# Patient Record
Sex: Female | Born: 1975 | Race: White | Hispanic: No | Marital: Married | Smoking: Never smoker
Health system: Southern US, Community
[De-identification: ages and names within clinical notes are randomized; demographics above are authoritative.]

## PROBLEM LIST (undated history)

## (undated) ENCOUNTER — Inpatient Hospital Stay (HOSPITAL_COMMUNITY): Payer: Self-pay

## (undated) DIAGNOSIS — J45909 Unspecified asthma, uncomplicated: Secondary | ICD-10-CM

## (undated) DIAGNOSIS — N979 Female infertility, unspecified: Secondary | ICD-10-CM

## (undated) DIAGNOSIS — M797 Fibromyalgia: Secondary | ICD-10-CM

## (undated) DIAGNOSIS — I341 Nonrheumatic mitral (valve) prolapse: Secondary | ICD-10-CM

## (undated) DIAGNOSIS — Z87442 Personal history of urinary calculi: Secondary | ICD-10-CM

## (undated) DIAGNOSIS — N809 Endometriosis, unspecified: Secondary | ICD-10-CM

## (undated) HISTORY — PX: EXPLORATORY LAPAROTOMY: SUR591

## (undated) HISTORY — PX: WISDOM TOOTH EXTRACTION: SHX21

## (undated) HISTORY — PX: LAPAROSCOPY: SHX197

## (undated) HISTORY — PX: OTHER SURGICAL HISTORY: SHX169

---

## 2007-01-02 ENCOUNTER — Ambulatory Visit (HOSPITAL_COMMUNITY): Admission: RE | Admit: 2007-01-02 | Discharge: 2007-01-02 | Payer: Self-pay | Admitting: Obstetrics and Gynecology

## 2007-01-30 ENCOUNTER — Ambulatory Visit (HOSPITAL_COMMUNITY): Admission: RE | Admit: 2007-01-30 | Discharge: 2007-01-30 | Payer: Self-pay | Admitting: Obstetrics and Gynecology

## 2007-01-30 IMAGING — US US FOLLICLE
1 series · 14 of 16 positions shown · non-contrast
Comparison: none

CLINICAL DATA: Femara cycle, days 3-7.
TRANSVAGINAL PELVIC ULTRASOUND - FOLLICLE STUDY:
TECHNIQUE: Transvaginal ultrasound examination of the pelvis was performed to evaluate the ovaries for follicles.  The uterus, adnexae, and cul-de-sac were also evaluated.

[Series 1: us follicle · 0.10mm/px · 14 of 61 slices shown]
[im 1/61]
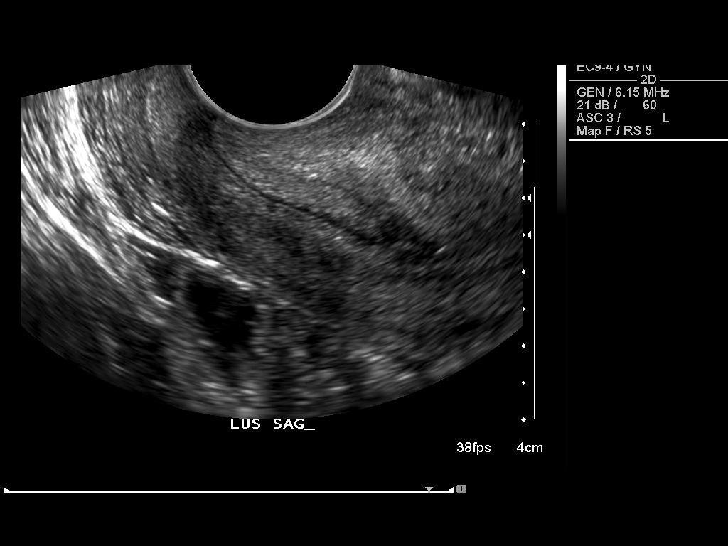
[im 5/61]
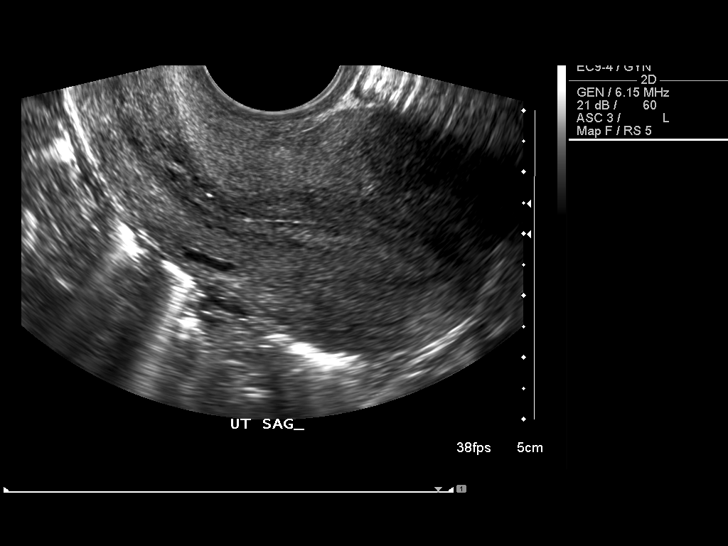
[im 9/61]
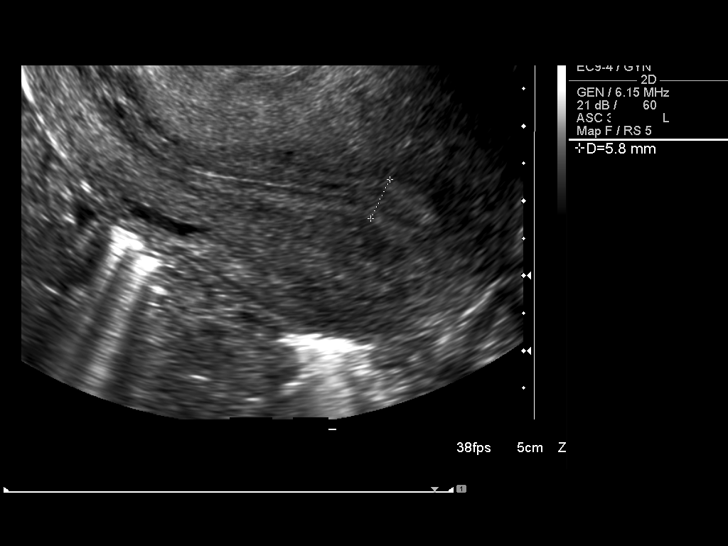
[im 17/61]
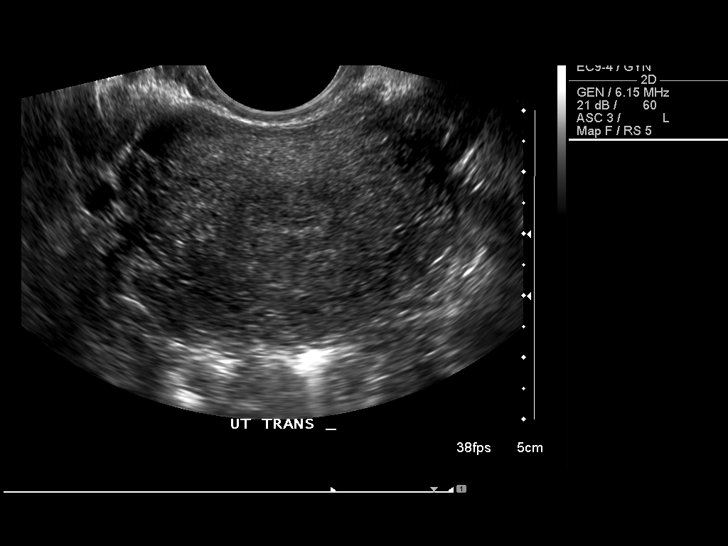
[im 21/61]
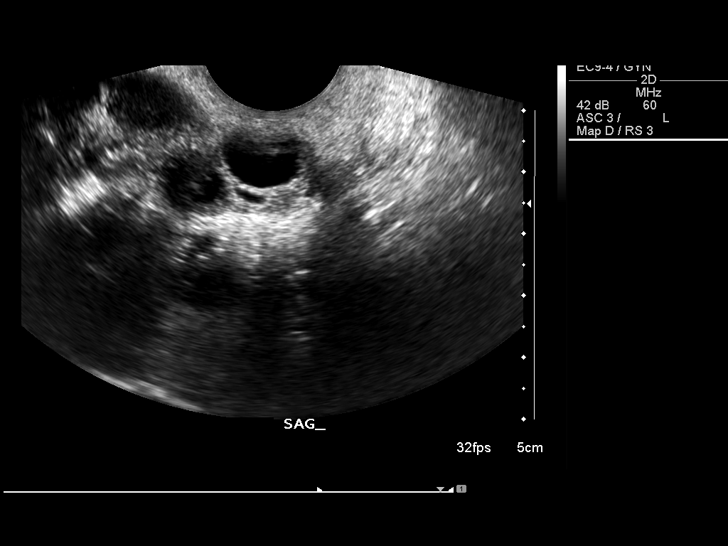
[im 25/61]
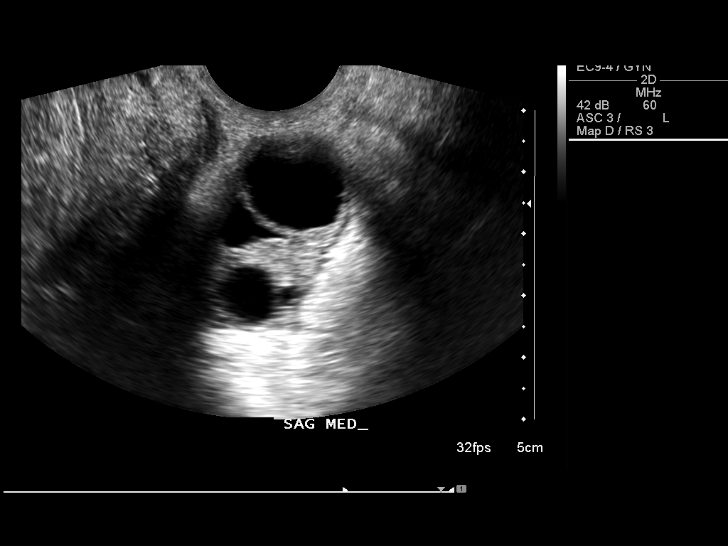
[im 29/61]
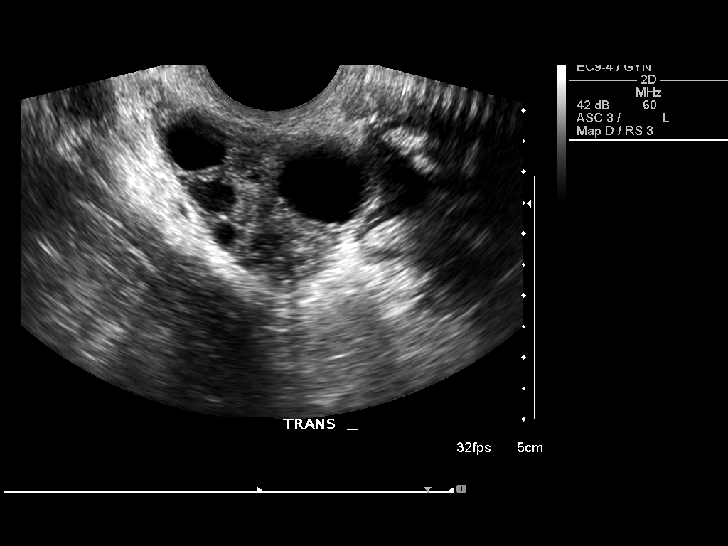
[im 33/61]
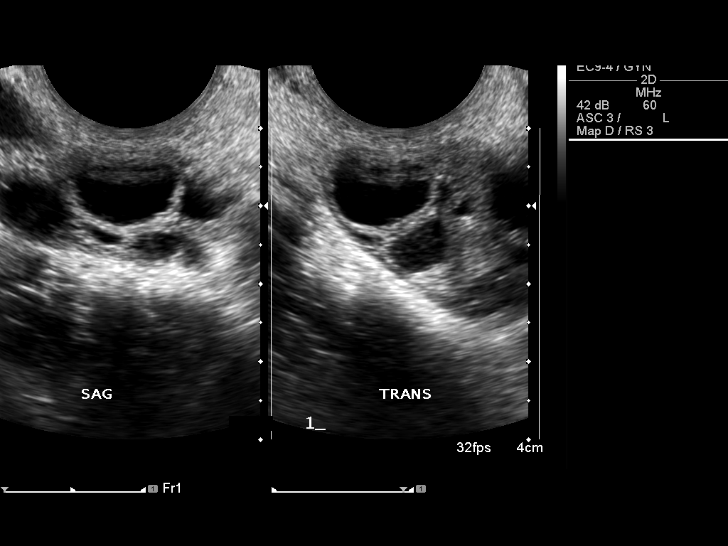
[im 37/61]
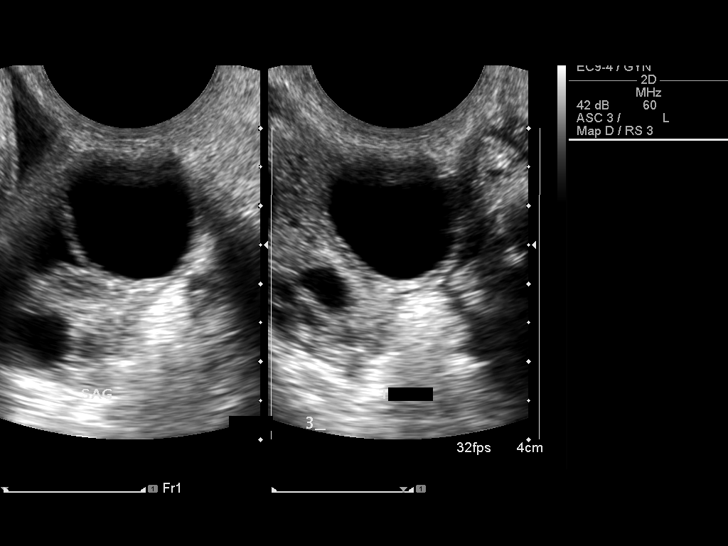
[im 41/61]
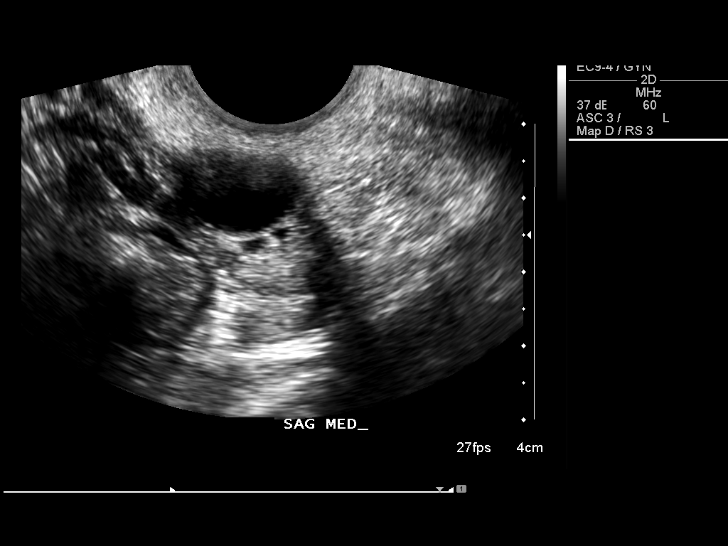
[im 49/61]
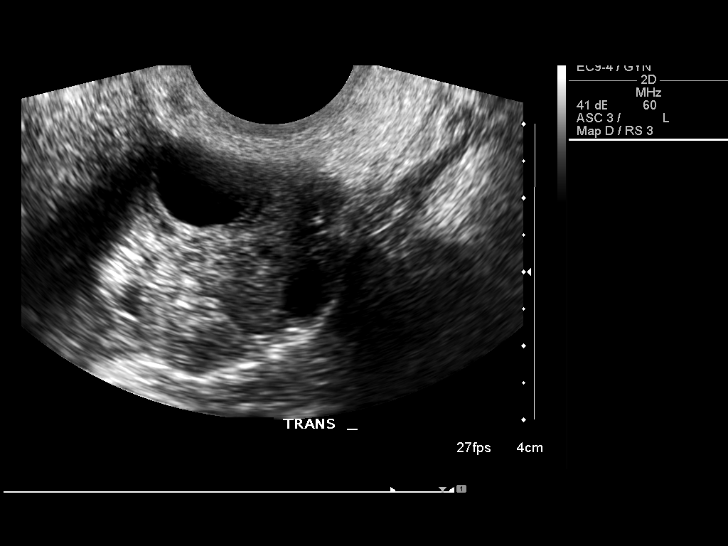
[im 53/61]
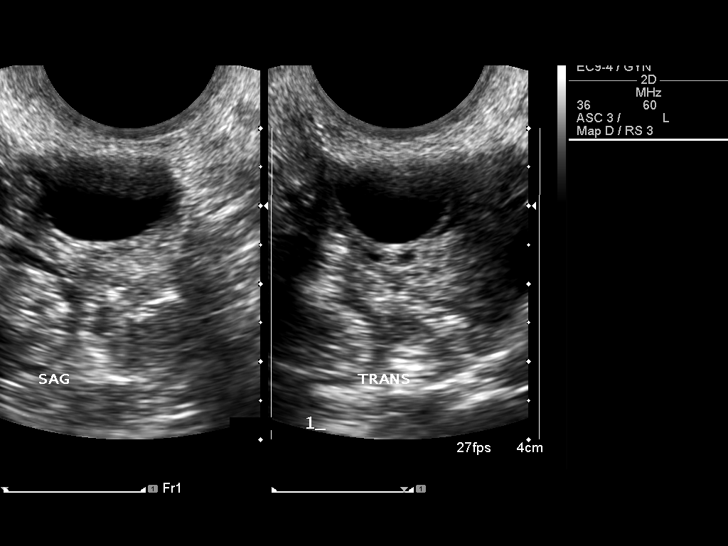
[im 57/61]
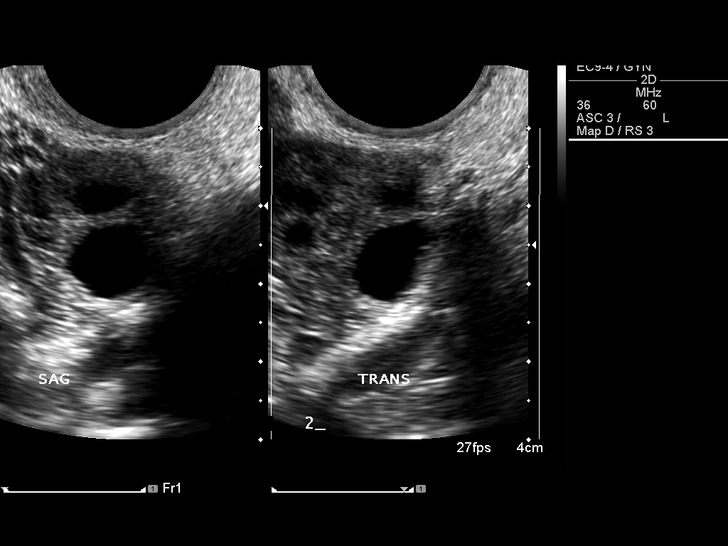
[im 61/61]
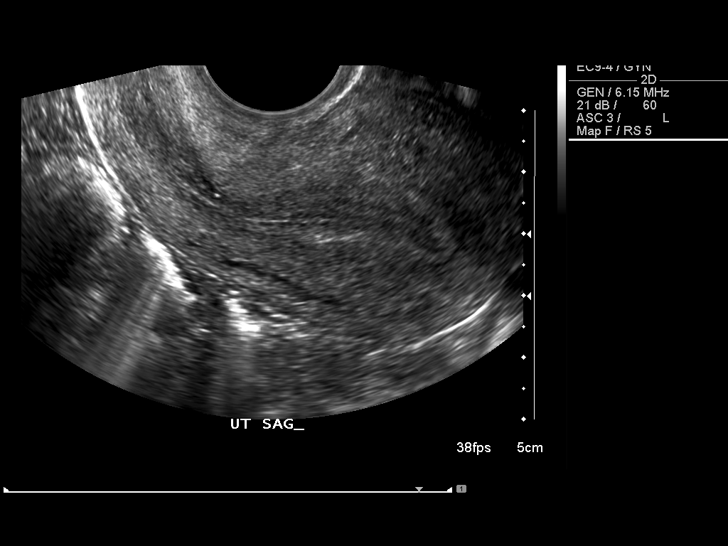

[14 of 16 positions shown; findings below may reference images not displayed]

FINDINGS: The uterus has a normal size, shape, and echotexture.  The endometrium is trilayered and measures 5-6 mm in width.  Both ovaries contain follicles.  On the right, there are 13 follicles that are less than 1 cm.  There are 3 follicles on the right  that measure over 1 cm.  The largest of these measures 19.8 x 17.4 x 13.0 mm in dimension.  The left ovary contains 7 follicles that are less than 1 cm.  There is 1 follicle greater than a centimeter on the left which measures 15.4 x 14.1 x 10.1 mm.  There is no free fluid within the cul-de-sac.
IMPRESSION: As above.

## 2007-02-27 ENCOUNTER — Ambulatory Visit (HOSPITAL_COMMUNITY): Admission: RE | Admit: 2007-02-27 | Discharge: 2007-02-27 | Payer: Self-pay | Admitting: Obstetrics and Gynecology

## 2007-04-30 ENCOUNTER — Emergency Department: Payer: Self-pay

## 2007-05-06 ENCOUNTER — Ambulatory Visit (HOSPITAL_COMMUNITY): Admission: RE | Admit: 2007-05-06 | Discharge: 2007-05-06 | Payer: Self-pay | Admitting: Urology

## 2010-05-09 ENCOUNTER — Other Ambulatory Visit: Admission: RE | Admit: 2010-05-09 | Discharge: 2010-05-09 | Payer: Self-pay | Admitting: Obstetrics and Gynecology

## 2010-11-14 ENCOUNTER — Inpatient Hospital Stay (HOSPITAL_COMMUNITY)
Admission: AD | Admit: 2010-11-14 | Discharge: 2010-11-18 | DRG: 371 | Disposition: A | Discharge status: Home / Self Care | Payer: BC Managed Care – PPO | Source: Ambulatory Visit | Attending: Obstetrics and Gynecology | Admitting: Obstetrics and Gynecology

## 2010-11-14 DIAGNOSIS — O139 Gestational [pregnancy-induced] hypertension without significant proteinuria, unspecified trimester: Secondary | ICD-10-CM | POA: Diagnosis present

## 2010-11-14 DIAGNOSIS — O3660X0 Maternal care for excessive fetal growth, unspecified trimester, not applicable or unspecified: Principal | ICD-10-CM | POA: Diagnosis present

## 2010-11-14 DIAGNOSIS — O409XX0 Polyhydramnios, unspecified trimester, not applicable or unspecified: Secondary | ICD-10-CM | POA: Diagnosis present

## 2010-11-14 LAB — COMPREHENSIVE METABOLIC PANEL
AST: 27 U/L (ref 0–37)
Albumin: 3.1 g/dL — ABNORMAL LOW (ref 3.5–5.2)
Alkaline Phosphatase: 162 U/L — ABNORMAL HIGH (ref 39–117)
BUN: 11 mg/dL (ref 6–23)
Chloride: 105 mEq/L (ref 96–112)
Potassium: 4.1 mEq/L (ref 3.5–5.1)
Total Bilirubin: 0.3 mg/dL (ref 0.3–1.2)
Total Protein: 6.8 g/dL (ref 6.0–8.3)

## 2010-11-14 LAB — CBC
MCHC: 32.4 g/dL (ref 30.0–36.0)
Platelets: 238 10*3/uL (ref 150–400)
RDW: 13.9 % (ref 11.5–15.5)
WBC: 12.2 10*3/uL — ABNORMAL HIGH (ref 4.0–10.5)

## 2010-11-14 LAB — URINE MICROSCOPIC-ADD ON

## 2010-11-14 LAB — URINALYSIS, ROUTINE W REFLEX MICROSCOPIC
Bilirubin Urine: NEGATIVE
Nitrite: NEGATIVE
Specific Gravity, Urine: 1.01 (ref 1.005–1.030)
Urobilinogen, UA: 0.2 mg/dL (ref 0.0–1.0)
pH: 5.5 (ref 5.0–8.0)

## 2010-11-14 LAB — DIFFERENTIAL
Basophils Absolute: 0 10*3/uL (ref 0.0–0.1)
Basophils Relative: 0 % (ref 0–1)
Eosinophils Absolute: 0.1 10*3/uL (ref 0.0–0.7)
Eosinophils Relative: 1 % (ref 0–5)
Monocytes Absolute: 1 10*3/uL (ref 0.1–1.0)

## 2010-11-14 LAB — LACTATE DEHYDROGENASE: LDH: 155 U/L (ref 94–250)

## 2010-11-14 LAB — URIC ACID: Uric Acid, Serum: 6 mg/dL (ref 2.4–7.0)

## 2010-11-15 ENCOUNTER — Inpatient Hospital Stay (HOSPITAL_COMMUNITY)
Admission: RE | Admit: 2010-11-15 | Payer: BC Managed Care – PPO | Source: Ambulatory Visit | Admitting: Obstetrics and Gynecology

## 2010-11-15 LAB — CBC
Hemoglobin: 11.1 g/dL — ABNORMAL LOW (ref 12.0–15.0)
MCH: 29.1 pg (ref 26.0–34.0)
Platelets: 203 10*3/uL (ref 150–400)
RBC: 3.82 MIL/uL — ABNORMAL LOW (ref 3.87–5.11)
WBC: 9.9 10*3/uL (ref 4.0–10.5)

## 2010-11-15 LAB — TYPE AND SCREEN: ABO/RH(D): B POS

## 2010-11-15 LAB — SURGICAL PCR SCREEN: MRSA, PCR: NEGATIVE

## 2010-11-15 LAB — ABO/RH: ABO/RH(D): B POS

## 2010-11-16 LAB — CBC
MCH: 28.8 pg (ref 26.0–34.0)
MCHC: 32.5 g/dL (ref 30.0–36.0)
MCV: 88.5 fL (ref 78.0–100.0)
Platelets: 193 10*3/uL (ref 150–400)
RDW: 14 % (ref 11.5–15.5)

## 2010-11-18 ENCOUNTER — Encounter (HOSPITAL_COMMUNITY)
Admission: RE | Admit: 2010-11-18 | Discharge: 2010-11-18 | Disposition: A | Discharge status: Home / Self Care | Payer: BC Managed Care – PPO | Source: Ambulatory Visit | Attending: Obstetrics and Gynecology | Admitting: Obstetrics and Gynecology

## 2010-11-18 DIAGNOSIS — O923 Agalactia: Secondary | ICD-10-CM | POA: Insufficient documentation

## 2010-11-18 NOTE — Op Note (Signed)
[name], Wanda Payne                 ACCOUNT NO.:  [id number]  MEDICAL RECORD NO.:  [id number]           PATIENT TYPE:  I  LOCATION:  9101                          FACILITY:  [facility]  PHYSICIAN:  Gerald Leitz, MD          DATE OF BIRTH:  Nov 13, 1975  DATE OF PROCEDURE:  11/15/2010 DATE OF DISCHARGE:                              OPERATIVE REPORT   PREOPERATIVE DIAGNOSES: 1. 39-week 3-day intrauterine pregnancy. 2. Macrosomia.  Estimated weight 10 pounds 2 ounces on ultrasound,     November 14, 2010. 3. Polyhydramnios. 4. Gestational hypertension.  POSTOPERATIVE DIAGNOSES: 1. 39-week 3-day intrauterine pregnancy. 2. Macrosomia.  Estimated weight 10 pounds 2 ounces on ultrasound,     November 14, 2010. 3. Polyhydramnios. 4. Gestational hypertension. 5. Thin meconium.  PROCEDURE:  Primary low-transverse cesarean section.  SURGEON:  Gerald Leitz, MD  ASSISTANT:  None.  ANESTHESIA:  Spinal.  FINDINGS:  Female infant, cephalic presentation, Apgars of 9 and 9 at 1 and 5 minutes respectively, weight of 9 pounds 15 ounces.  SPECIMEN:  Placenta sent to labor and delivery.  ESTIMATED BLOOD LOSS:  900 mL.  COMPLICATIONS:  None.  PROCEDURE IN DETAIL:  The patient was taken to the operating room where spinal anesthetic was placed.  She was prepped and draped in the usual sterile fashion.  A Pfannenstiel incision was made with a scalpel and carried down to the underlying layer of fascia.  The fascia was incised in the midline and the incision was extended laterally.  The inferior aspect of fascial incision was grasped with Kocher clamps, elevated, and underlying rectus muscles were dissected off.  This was repeated on the superior aspect of the fascial incision.  The rectus muscles were dissected in the midline and separated in the midline.  Peritoneum was identified and entered bluntly.  The incision was extended superiorly and inferiorly with good visualization of the bladder.  An  Alexis abdominal retractor was placed.  Vesicouterine peritoneum was identified, tented up, and entered with Metzenbaum scissors.  The incision was extended laterally and a bladder flap was created digitally.  Low-transverse incision was made with a scalpel.  The infant's head was removed and thin meconium was noted.  Mouth and nose were bulb suctioned.  Nuchal cord x1 was reduced.  The rest of the body was delivered.  The cord was clamped x2 and cut.  The infant was handed to the awaiting neonatologist.  Cord blood was drawn and the placenta was expressed.  The uterus was cleared of all clot and debris.  The uterine incision was repaired with 0-Vicryl in a running locked fashion. A second layer of suture was used and excellent hemostasis was noted. The abdomen was then copiously irrigated.  Excellent hemostasis was noted.  The Alexis retractor was removed.  Fascia was reapproximated with 0-PDS.  Scarpa fascia was reapproximated with 2-0 Vicryl.  Skin was closed with 4-0 Vicryl.  Sponge, lap, and needle counts were correct x2. The patient was taken to the recovery room awake in a stable condition. She was given clindamycin and gentamicin prior to incision.  Gerald Leitz, MD     TC/MEDQ  D:  11/15/2010  T:  11/15/2010  Job:  [id number]  Electronically Signed by Gerald Leitz MD on 11/18/2010 11:10:59 AM

## 2010-11-19 ENCOUNTER — Inpatient Hospital Stay (HOSPITAL_COMMUNITY): Admission: AD | Admit: 2010-11-19 | Payer: Self-pay | Source: Home / Self Care | Admitting: Obstetrics and Gynecology

## 2010-11-30 NOTE — Discharge Summary (Signed)
  [name], Wanda Payne                 ACCOUNT NO.:  [id number]  MEDICAL RECORD NO.:  [id number]           PATIENT TYPE:  I  LOCATION:  9101                          FACILITY:  [facility]  PHYSICIAN:  Gerald Leitz, MD          DATE OF BIRTH:  [birth year]  DATE OF ADMISSION:  11/14/2010 DATE OF DISCHARGE:  11/18/2010                              DISCHARGE SUMMARY   ADMISSION DIAGNOSES: 1. A 39-week 3-day intrauterine pregnancy. 2. Macrosomia. 3. Polyhydramnios. 4. Gestational hypertension.  DISCHARGE DIAGNOSES: 1. A 39-week 3-day intrauterine pregnancy. 2. Macrosomia. 3. Polyhydramnios. 4. Gestational hypertension. 5. Thin meconium. 6. Nuchal cord. 7. Status post primary low transverse cesarean section.  BRIEF HOSPITAL COURSE:  The patient was admitted on November 14, 2010, when she presented complaining of possible leakage of fluid.  She was negative for rupture, however, her blood pressure was slightly elevated to 140s over 80s, and she was observed.  She had already been scheduled for a C-section on November 15, 2010, and this was performed at the scheduled time.  She delivered a live born female infant with Apgars of 9 and 9 at 1 and 5 minutes respectively, weighing 9 pounds and 15 ounces.  She did well postoperatively.  Her blood pressures remained less than 140s over 80s.  Her hemoglobin on postop day #1 was 9.5.  She was discharged home in stable and improved condition on the following medications; Motrin, Percocet, and iron.  She will follow up in the office in 2 weeks for an incision check, sooner if needed.  CONDITION AT DISCHARGE:  Stable and improved.     Gerald Leitz, MD     TC/MEDQ  D:  11/18/2010  T:  11/18/2010  Job:  [id number]  Electronically Signed by Gerald Leitz MD on 11/29/2010 08:41:27 AM

## 2011-02-05 NOTE — Op Note (Signed)
Wanda Payne, Wanda Payne                 ACCOUNT NO.:  [id number]   MEDICAL RECORD NO.:  [id number]          PATIENT TYPE:  AMB   LOCATION:  DAY                          FACILITY:  [facility]   PHYSICIAN:  Sigmund I. Patsi Sears, M.D.DATE OF BIRTH:  [birth year]   DATE OF PROCEDURE:  05/06/2007  DATE OF DISCHARGE:                               OPERATIVE REPORT   PREOPERATIVE DIAGNOSIS:  Right distal ureteral calculus.   POSTOPERATIVE DIAGNOSIS:  Right distal ureteral calculus.   OPERATION:  Cystourethroscopy, right retrograde pyelogram  interpretation, right ureteroscopy, attempted basket extraction of right  ureteral stone, right double-J catheter (5 x 24 cm).   SURGEON:  Sigmund I. Patsi Sears, M.D.   ANESTHESIA:  General LMA.   PREPARATION:  After appropriate preanesthesia, the patient is brought to  the operating room and placed on the operating room table in the dorsal  supine position where general LMA anesthesia was induced.  She was then  replaced in the dorsal lithotomy position and her pubis was prepped with  Betadine solution and draped in the usual fashion.   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  She has been treated conservatively. She has  the diagnosis of a 2-3 mm right distal ureteral calculus, has been  treated conservatively with Toradol and Flomax, but has not been able to  pass her stone.  She has had an episode of gross hematuria, recurrent  colic today.  She is now for retrograde pyelography and basket  extraction.   PROCEDURE:  With the patient in the dorsal lithotomy position, the pubis  was prepped and draped in the usual fashion.  Cystourethroscopy was  accomplished and shows a very markedly edematous right ureteral orifice.  Retrograde pyelogram was carefully performed with a minimal amount of  contrast out, and it was felt that the distal ureteral calculus was  identified.  Magnification was used  during the imaging process.   The ureteroscope was then passed into the ureter and a large flap of  tissue was identified in the distal ureter.  There was no stone behind  it and complete ureteroscopy into the renal pelvis did not reveal a  stone.  Repeat ureteroscopy again shows the edematous irritated flap of  tissue in the distal ureter and it is believed that the stone could  possibly have been embedded in this tissue and then broken loose.  The  cystoscopy did not show the stone within the bladder.  It was elected to  place a double-J catheter because of the severe edema of the ureteral  orifice, and the flap of tissue  identified in the lower ureter.  A 5-French x 24 cm Polaris stent was  then placed in the kidney and then the bladder.  The patient tolerated  the procedure well.  She was given IV Toradol, awakened, and taken to  the recovery room in good condition.      Sigmund I. Patsi Sears, M.D.  Electronically Signed     SIT/MEDQ  D:  05/06/2007  T:  05/07/2007  Job:  [id number] 

## 2011-03-29 NOTE — H&P (Signed)
  NAMEMOHOGANY, TOPPINS NO.:  [id number]  MEDICAL RECORD NO.:  [id number]         PATIENT TYPE:  WINP  LOCATION:                                FACILITY:  [facility]  PHYSICIAN:  Gerald Leitz, MD          DATE OF BIRTH:  1976-09-06  DATE OF ADMISSION:  11/15/2010 DATE OF DISCHARGE:                             HISTORY & PHYSICAL   The patient is scheduled for surgery on November 15, 2010.  HISTORY OF PRESENT ILLNESS:  This is a 35 year old G1, P0 at 39 weeks and 3 days based on an EDD of November 19, 2010.  Pregnancy is complicated by macrosomia with estimated fetal weight of 4602 g, 10 pounds and 2 ounces on ultrasound November 14, 2010.  She reports positive fetal movement.  No leakage of fluid.  No vaginal bleeding.  No irregular contractions.  She desires elective cesarean section due to baby's fetal weight.  PAST MEDICAL HISTORY:  History of kidney stones and exercise-induced asthma as a teenager.  She has not had problems with this for years.  PAST SURGICAL HISTORY:  Laparoscopy and IVF as well as surgery for kidney stones.  PAST OB HISTORY:  Current, this is an IVF pregnancy.  GYN HISTORY:  No history of sexually transmitted diseases.  Last Pap smear was on May 09, 2010, and this was normal.  SOCIAL HISTORY:  The patient is married.  She denies alcohol, tobacco, or illicit drug use.  She works in a [employer], water.  REVIEW OF SYSTEMS:  Negative except as stated in the history of current illness.  FAMILY HISTORY:  Noncontributory.  PHYSICAL EXAMINATION:  VITAL SIGNS:  Blood pressure is 136/90, weight is 242.5 pounds. CARDIOVASCULAR:  Regular rate and rhythm. LUNGS:  Clear to auscultation bilaterally. ABDOMEN:  Gravid, nontender. EXTREMITIES:  Trace edema bilaterally. GU:  Cervix is 2-3 cm, 50% effaced.  LABORATORY DATA:  Strep negative.  HIV nonreactive.  ASSESSMENT/PLAN:  A 39 and 3-week intrauterine pregnancy with macrosomia, desires  cesarean section.  Risks, benefits, alternatives of C-section were discussed with the patient including, but not limited to infection, bleeding, damage to bowel, bladder, and baby with the need for further surgery, risk of transfusion HIV, hepatitis B and C were discussed.  The patient voiced understanding of risks and desires to proceed with primary cesarean section.     Gerald Leitz, MD     TC/MEDQ  D:  11/14/2010  T:  11/14/2010  Job:  [id number]  Electronically Signed by Gerald Leitz MD on 11/18/2010 11:10:55 AM

## 2011-07-04 ENCOUNTER — Other Ambulatory Visit (HOSPITAL_COMMUNITY)
Admission: RE | Admit: 2011-07-04 | Discharge: 2011-07-04 | Disposition: A | Discharge status: Home / Self Care | Payer: BC Managed Care – PPO | Source: Ambulatory Visit | Attending: Obstetrics and Gynecology | Admitting: Obstetrics and Gynecology

## 2011-07-04 DIAGNOSIS — Z01419 Encounter for gynecological examination (general) (routine) without abnormal findings: Secondary | ICD-10-CM | POA: Insufficient documentation

## 2011-07-08 LAB — PREGNANCY, URINE

## 2012-06-29 ENCOUNTER — Other Ambulatory Visit: Payer: Self-pay | Admitting: Family Medicine

## 2012-06-29 ENCOUNTER — Ambulatory Visit
Admission: RE | Admit: 2012-06-29 | Discharge: 2012-06-29 | Disposition: A | Discharge status: Home / Self Care | Payer: BC Managed Care – PPO | Source: Ambulatory Visit | Attending: Family Medicine | Admitting: Family Medicine

## 2012-06-29 DIAGNOSIS — R0781 Pleurodynia: Secondary | ICD-10-CM

## 2012-08-19 ENCOUNTER — Telehealth: Payer: Self-pay | Admitting: Oncology

## 2012-08-19 NOTE — Telephone Encounter (Signed)
Pt declined np referral to Dr. Gaylyn Rong.

## 2012-08-23 ENCOUNTER — Inpatient Hospital Stay (HOSPITAL_COMMUNITY): Payer: BC Managed Care – PPO

## 2012-08-23 ENCOUNTER — Inpatient Hospital Stay (HOSPITAL_COMMUNITY)
Admission: AD | Admit: 2012-08-23 | Discharge: 2012-08-23 | Disposition: A | Discharge status: Home / Self Care | Payer: BC Managed Care – PPO | Source: Ambulatory Visit | Attending: Obstetrics & Gynecology | Admitting: Obstetrics & Gynecology

## 2012-08-23 ENCOUNTER — Encounter (HOSPITAL_COMMUNITY): Payer: Self-pay | Admitting: *Deleted

## 2012-08-23 DIAGNOSIS — O200 Threatened abortion: Secondary | ICD-10-CM

## 2012-08-23 DIAGNOSIS — O209 Hemorrhage in early pregnancy, unspecified: Secondary | ICD-10-CM | POA: Insufficient documentation

## 2012-08-23 DIAGNOSIS — O468X9 Other antepartum hemorrhage, unspecified trimester: Secondary | ICD-10-CM

## 2012-08-23 DIAGNOSIS — R109 Unspecified abdominal pain: Secondary | ICD-10-CM | POA: Insufficient documentation

## 2012-08-23 DIAGNOSIS — IMO0002 Reserved for concepts with insufficient information to code with codable children: Secondary | ICD-10-CM

## 2012-08-23 HISTORY — DX: Endometriosis, unspecified: N80.9

## 2012-08-23 HISTORY — DX: Fibromyalgia: M79.7

## 2012-08-23 LAB — URINALYSIS, ROUTINE W REFLEX MICROSCOPIC
Bilirubin Urine: NEGATIVE
Glucose, UA: NEGATIVE mg/dL
Ketones, ur: NEGATIVE mg/dL
Leukocytes, UA: NEGATIVE
Protein, ur: NEGATIVE mg/dL
pH: 6 (ref 5.0–8.0)

## 2012-08-23 LAB — CBC
HCT: 37.3 % (ref 36.0–46.0)
Platelets: 269 10*3/uL (ref 150–400)
RDW: 12.9 % (ref 11.5–15.5)
WBC: 7.9 10*3/uL (ref 4.0–10.5)

## 2012-08-23 LAB — URINE MICROSCOPIC-ADD ON: WBC, UA: NONE SEEN WBC/hpf (ref <3)

## 2012-08-23 LAB — WET PREP, GENITAL
Clue Cells Wet Prep HPF POC: NONE SEEN
Trich, Wet Prep: NONE SEEN
WBC, Wet Prep HPF POC: NONE SEEN

## 2012-08-23 LAB — HCG, QUANTITATIVE, PREGNANCY: hCG, Beta Chain, Quant, S: 9235 m[IU]/mL — ABNORMAL HIGH (ref <5)

## 2012-08-23 NOTE — MAU Note (Signed)
Pt reports started having some cramping this morning in lowe abd that wraps around to her back. Went to church and then had some dark red bleeding. Pt reports having intercourse last night.

## 2012-09-18 LAB — OB RESULTS CONSOLE ABO/RH

## 2012-09-28 ENCOUNTER — Other Ambulatory Visit: Payer: Self-pay | Admitting: Obstetrics and Gynecology

## 2012-09-28 ENCOUNTER — Other Ambulatory Visit (HOSPITAL_COMMUNITY)
Admission: RE | Admit: 2012-09-28 | Discharge: 2012-09-28 | Disposition: A | Discharge status: Home / Self Care | Payer: BC Managed Care – PPO | Source: Ambulatory Visit | Attending: Obstetrics and Gynecology | Admitting: Obstetrics and Gynecology

## 2012-09-28 DIAGNOSIS — Z01419 Encounter for gynecological examination (general) (routine) without abnormal findings: Secondary | ICD-10-CM | POA: Insufficient documentation

## 2012-09-28 DIAGNOSIS — Z113 Encounter for screening for infections with a predominantly sexual mode of transmission: Secondary | ICD-10-CM | POA: Insufficient documentation

## 2012-12-16 ENCOUNTER — Encounter (HOSPITAL_COMMUNITY): Payer: Self-pay

## 2012-12-16 ENCOUNTER — Inpatient Hospital Stay (HOSPITAL_COMMUNITY)
Admission: AD | Admit: 2012-12-16 | Discharge: 2012-12-16 | Disposition: A | Discharge status: Home / Self Care | Payer: BC Managed Care – PPO | Attending: Obstetrics and Gynecology | Admitting: Obstetrics and Gynecology

## 2012-12-16 ENCOUNTER — Inpatient Hospital Stay (HOSPITAL_COMMUNITY): Payer: BC Managed Care – PPO

## 2012-12-16 DIAGNOSIS — M545 Low back pain, unspecified: Secondary | ICD-10-CM | POA: Insufficient documentation

## 2012-12-16 DIAGNOSIS — O4700 False labor before 37 completed weeks of gestation, unspecified trimester: Secondary | ICD-10-CM | POA: Insufficient documentation

## 2012-12-16 DIAGNOSIS — R109 Unspecified abdominal pain: Secondary | ICD-10-CM | POA: Insufficient documentation

## 2012-12-16 HISTORY — DX: Female infertility, unspecified: N97.9

## 2012-12-16 HISTORY — DX: Personal history of urinary calculi: Z87.442

## 2012-12-16 HISTORY — DX: Unspecified asthma, uncomplicated: J45.909

## 2012-12-16 HISTORY — DX: Nonrheumatic mitral (valve) prolapse: I34.1

## 2012-12-16 LAB — URINALYSIS, ROUTINE W REFLEX MICROSCOPIC
Leukocytes, UA: NEGATIVE
Nitrite: NEGATIVE
Specific Gravity, Urine: <1.005 — ABNORMAL LOW (ref 1.005–1.030)
Urobilinogen, UA: 0.2 mg/dL (ref 0.0–1.0)
pH: 5.5 (ref 5.0–8.0)

## 2012-12-16 LAB — WET PREP, GENITAL
Clue Cells Wet Prep HPF POC: NONE SEEN
Trich, Wet Prep: NONE SEEN

## 2012-12-16 MED ORDER — ACETAMINOPHEN 500 MG PO TABS
1000.0000 mg | ORAL_TABLET | Freq: Once | ORAL | Status: AC
  Administered 2012-12-16: 1000 mg via ORAL
  Filled 2012-12-16: qty 2

## 2012-12-16 NOTE — MAU Note (Signed)
Rates pain in low back 5/10, lower abd pain 3/10

## 2012-12-16 NOTE — MAU Note (Signed)
Lower abdominal cramping and back pain since 4am this morning. Some nausea since then, but no vomiting. Denies leaking of fluid or vaginal bleeding.

## 2012-12-16 NOTE — MAU Provider Note (Signed)
History     CSN: [id number]  Arrival date and time: 12/16/12 7829   First Provider Initiated Contact with Patient 12/16/12 [phone]      Chief Complaint  Patient presents with  . Back Pain  . Abdominal Cramping   HPI [name] is a 37 y.o. G2P1001 at [redacted]w[redacted]d who presents to MAU today with complaint of low back pain and lower abdominal cramping since 4 am today. Has not taken any pain medications. Hydrated and ate something without relief. Some nausea without vomiting. Denies vaginal bleeding, abnormal discharge or LOF. + FM No contractions. Some abdominal tightening with change of position.   OB History   Grav Para Term Preterm Abortions TAB SAB Ect Mult Living   2 1 1       1       Past Medical History  Diagnosis Date  . Fibromyalgia   . Endometriosis   . MVP (mitral valve prolapse)     as teenager  . History of kidney stones   . Infertility, female   . Asthma     exercise induced as a child    Past Surgical History  Procedure Laterality Date  . Wisdom tooth extraction    . Kidney stent    . Cesarean section    . Exploratory laparotomy    . Laparoscopy      Family History  Problem Relation Age of Onset  . Hypertension Father     History  Substance Use Topics  . Smoking status: Never Smoker   . Smokeless tobacco: Not on file  . Alcohol Use: No    Allergies:  Allergies  Allergen Reactions  . Penicillins Hives    Prescriptions prior to admission  Medication Sig Dispense Refill  . Prenatal Vit-Fe Fumarate-FA (PRENATAL MULTIVITAMIN) TABS Take 1 tablet by mouth daily.        Review of Systems  Constitutional: Negative for fever.  Gastrointestinal: Positive for nausea and abdominal pain. Negative for vomiting.  Genitourinary: Negative for dysuria, urgency and frequency.       Neg - vaginal bleeding Neg - vaginal discharge, LOF  Musculoskeletal: Positive for back pain.   Physical Exam   Blood pressure 122/73, pulse 92, temperature 98.1 F (36.7  C), temperature source Oral, resp. rate 18, height 5\' 9"  (1.753 m), weight 222 lb 9.6 oz (100.971 kg), last menstrual period 07/15/2012.  Physical Exam  Constitutional: She is oriented to person, place, and time. She appears well-developed and well-nourished. No distress.  HENT:  Head: Normocephalic and atraumatic.  Cardiovascular: Normal rate, regular rhythm and normal heart sounds.   Respiratory: Effort normal and breath sounds normal. No respiratory distress.  GI: Soft. Bowel sounds are normal. She exhibits no distension and no mass. There is no tenderness. There is no rebound and no guarding.  Genitourinary: Vagina normal. Uterus is enlarged (appropriate for GA). Uterus is not tender. Cervix exhibits discharge (small amount of thin white discharge noted at the cervical os and in the vagina). Cervix exhibits no motion tenderness and no friability.  Cervix closed, thick  Neurological: She is alert and oriented to person, place, and time.  Skin: Skin is warm and dry. No erythema.  Psychiatric: She has a normal mood and affect.   Results for orders placed during the hospital encounter of 12/16/12 (from the past 24 hour(s))  URINALYSIS, ROUTINE W REFLEX MICROSCOPIC     Status: Abnormal   Collection Time    12/16/12  6:48 AM  Result Value Range   Color, Urine STRAW (*) YELLOW   APPearance CLEAR  CLEAR   Specific Gravity, Urine <1.005 (*) 1.005 - 1.030   pH 5.5  5.0 - 8.0   Glucose, UA 100 (*) NEGATIVE mg/dL   Hgb urine dipstick NEGATIVE  NEGATIVE   Bilirubin Urine NEGATIVE  NEGATIVE   Ketones, ur NEGATIVE  NEGATIVE mg/dL   Protein, ur NEGATIVE  NEGATIVE mg/dL   Urobilinogen, UA 0.2  0.0 - 1.0 mg/dL   Nitrite NEGATIVE  NEGATIVE   Leukocytes, UA NEGATIVE  NEGATIVE  WET PREP, GENITAL     Status: Abnormal   Collection Time    12/16/12  7:36 AM      Result Value Range   Yeast Wet Prep HPF POC NONE SEEN  NONE SEEN   Trich, Wet Prep NONE SEEN  NONE SEEN   Clue Cells Wet Prep HPF POC  NONE SEEN  NONE SEEN   WBC, Wet Prep HPF POC FEW (*) NONE SEEN    MAU Course  Procedures   MDM Discussed patient with Dr. Richardson Dopp. She would like Wet prep and urine culture as well as [place] for cervical length as patient is pregnant with twins and having some uterine irritability on TOCO.   Assessment and Plan  0800 - Patient in [place]. Care turned over to Nolene Bernheim, NP [phone]  Patient still has some lower abdominal cramping, back pain and nausea.  Consult with Dr. Richardson Dopp and reviewed ultrasound results and plan of care.  Assessment Threatened preterm labor  Plan Will discharge as the cervical length is normal. Call the office with any worsening of symptoms or vaginal bleeding. Tylenol 1 gm po now. Keep appointments in the office as scheduled.  Freddi Starr, PA-C  12/16/2012, 8:03 AM

## 2012-12-17 LAB — URINE CULTURE: Culture: NO GROWTH

## 2013-02-26 ENCOUNTER — Inpatient Hospital Stay (HOSPITAL_COMMUNITY)
Admission: AD | Admit: 2013-02-26 | Discharge: 2013-02-26 | Disposition: A | Discharge status: Home / Self Care | Payer: BC Managed Care – PPO | Source: Ambulatory Visit | Attending: Obstetrics and Gynecology | Admitting: Obstetrics and Gynecology

## 2013-02-26 ENCOUNTER — Encounter (HOSPITAL_COMMUNITY): Payer: Self-pay | Admitting: *Deleted

## 2013-02-26 DIAGNOSIS — R109 Unspecified abdominal pain: Secondary | ICD-10-CM | POA: Insufficient documentation

## 2013-02-26 DIAGNOSIS — O30009 Twin pregnancy, unspecified number of placenta and unspecified number of amniotic sacs, unspecified trimester: Secondary | ICD-10-CM | POA: Insufficient documentation

## 2013-02-26 DIAGNOSIS — O4700 False labor before 37 completed weeks of gestation, unspecified trimester: Secondary | ICD-10-CM | POA: Insufficient documentation

## 2013-02-26 LAB — URINALYSIS, ROUTINE W REFLEX MICROSCOPIC
Ketones, ur: NEGATIVE mg/dL
Leukocytes, UA: NEGATIVE
Nitrite: NEGATIVE
Specific Gravity, Urine: 1.015 (ref 1.005–1.030)
Urobilinogen, UA: 0.2 mg/dL (ref 0.0–1.0)
pH: 7 (ref 5.0–8.0)

## 2013-02-26 LAB — FETAL FIBRONECTIN: Fetal Fibronectin: NEGATIVE

## 2013-02-26 LAB — URINE MICROSCOPIC-ADD ON

## 2013-02-26 MED ORDER — LACTATED RINGERS IV SOLN
INTRAVENOUS | Status: DC
  Administered 2013-02-26: 16:00:00 via INTRAVENOUS

## 2013-02-26 MED ORDER — NIFEDIPINE 10 MG PO CAPS
10.0000 mg | ORAL_CAPSULE | Freq: Once | ORAL | Status: AC
  Administered 2013-02-26: 10 mg via ORAL
  Filled 2013-02-26: qty 1

## 2013-02-26 MED ORDER — LACTATED RINGERS IV BOLUS (SEPSIS)
1000.0000 mL | Freq: Once | INTRAVENOUS | Status: AC
  Administered 2013-02-26: 1000 mL via INTRAVENOUS

## 2013-02-26 NOTE — MAU Provider Note (Signed)
History     CSN: [id number]  Arrival date and time: 02/26/13 1234   First Provider Initiated Contact with Patient 02/26/13 1330      Chief Complaint  Patient presents with  . Abdominal Pain   HPI [name] is a 37 y.o. G2P1001 at [redacted]w[redacted]d with twin gestation who presents to MAU today with complaint of increased contractions and large "clots of white and mucus" from the vagina. The patient endorses occasional contractions at night prior to today. She denies abdominal pain today. She denies bleeding or LOF. She reports good fetal movement.   OB History   Grav Para Term Preterm Abortions TAB SAB Ect Mult Living   2 1 1       1       Past Medical History  Diagnosis Date  . Fibromyalgia   . Endometriosis   . MVP (mitral valve prolapse)     as teenager  . History of kidney stones   . Infertility, female   . Asthma     exercise induced as a child    Past Surgical History  Procedure Laterality Date  . Wisdom tooth extraction    . Kidney stent    . Cesarean section    . Exploratory laparotomy    . Laparoscopy      Family History  Problem Relation Age of Onset  . Hypertension Father     History  Substance Use Topics  . Smoking status: Never Smoker   . Smokeless tobacco: Not on file  . Alcohol Use: No    Allergies:  Allergies  Allergen Reactions  . Penicillins Hives    Prescriptions prior to admission  Medication Sig Dispense Refill  . acetaminophen (TYLENOL) 500 MG tablet Take 500 mg by mouth every 6 (six) hours as needed for pain.      . famotidine (PEPCID) 20 MG tablet Take 20 mg by mouth 2 (two) times daily.      . Prenatal Vit-Fe Fumarate-FA (PRENATAL MULTIVITAMIN) TABS Take 1 tablet by mouth daily.        Review of Systems  Constitutional: Negative for fever and malaise/fatigue.  Gastrointestinal: Negative for nausea, vomiting, abdominal pain, diarrhea and constipation.  Genitourinary: Negative for dysuria, urgency and frequency.       Neg -  vaginal bleeding, LOF + vaginal discharge   Physical Exam   Blood pressure 125/65, pulse 109, temperature 97.3 F (36.3 C), temperature source Oral, resp. rate 20, height 5' 8.5" (1.74 m), weight 238 lb 6.4 oz (108.138 kg), last menstrual period 07/15/2012.  Physical Exam  Constitutional: She is oriented to person, place, and time. She appears well-developed and well-nourished. No distress.  HENT:  Head: Normocephalic and atraumatic.  Cardiovascular: Normal rate, regular rhythm and normal heart sounds.   Respiratory: Effort normal and breath sounds normal. No respiratory distress.  GI: Soft. Bowel sounds are normal. She exhibits no distension and no mass. There is no tenderness. There is no rebound and no guarding.  Neurological: She is alert and oriented to person, place, and time.  Skin: Skin is warm and dry. No erythema.  Psychiatric: She has a normal mood and affect.  Dilation: Closed Effacement (%): Thick Cervical Position: Posterior Station:  (high) Exam by:: Rebbeca Paul RN  Results for orders placed during the hospital encounter of 02/26/13 (from the past 24 hour(s))  URINALYSIS, ROUTINE W REFLEX MICROSCOPIC     Status: Abnormal   Collection Time    02/26/13  1:07 PM  Result Value Range   Color, Urine YELLOW  YELLOW   APPearance CLEAR  CLEAR   Specific Gravity, Urine 1.015  1.005 - 1.030   pH 7.0  5.0 - 8.0   Glucose, UA NEGATIVE  NEGATIVE mg/dL   Hgb urine dipstick TRACE (*) NEGATIVE   Bilirubin Urine NEGATIVE  NEGATIVE   Ketones, ur NEGATIVE  NEGATIVE mg/dL   Protein, ur NEGATIVE  NEGATIVE mg/dL   Urobilinogen, UA 0.2  0.0 - 1.0 mg/dL   Nitrite NEGATIVE  NEGATIVE   Leukocytes, UA NEGATIVE  NEGATIVE  URINE MICROSCOPIC-ADD ON     Status: None   Collection Time    02/26/13  1:07 PM      Result Value Range   Squamous Epithelial / LPF RARE  RARE   RBC / HPF 0-2  <3 RBC/hpf  FETAL FIBRONECTIN     Status: None   Collection Time    02/26/13  3:10 PM      Result  Value Range   Fetal Fibronectin NEGATIVE  NEGATIVE     MAU Course  Procedures None  MDM Discussed with Dr. Dion Body. 1 L IV fluids and monitor contractions Dr. Dion Body reviewed patient NST and ordered procardia.  Dr. Dion Body to MAU to re-evaluate patient. Patient ok for discharge.   Assessment and Plan  A: Preterm contractions  P: Dr. Dion Body in MAU for patient discharge  Freddi Starr, PA-C  02/26/2013, 6:35 PM

## 2013-02-26 NOTE — MAU Note (Signed)
Patient states she is having twins. States she is scheduled for a repeat cesarean section. States she has been having constant abdominal pain.

## 2013-02-26 NOTE — MAU Note (Signed)
Intermittent abd pain x 2 days, white discharge, denies bleeding.

## 2013-02-28 ENCOUNTER — Telehealth: Payer: Self-pay | Admitting: Obstetrics and Gynecology

## 2013-02-28 LAB — URINE CULTURE: Colony Count: 60000

## 2013-02-28 NOTE — Telephone Encounter (Signed)
Testing telephone call routing

## 2013-02-28 NOTE — Telephone Encounter (Signed)
TC from patient--32 weeks, twins, G2P1001, with cramping and back pain. Seen in MAU 02/26/13 for cramping.  Cervix closed, FFN negative.  Received single dose of Procardia with resolution of contractions. Recommended patient be seen in MAU--she declined and advised that Dr. Dion Body told her that if she had contractions over the weekend, the on-call providers would call in  Procardia for her. I told the patient I would do that for a limited dosing, but if the contractions do not abate, I would strongly recommend she come to MAU. She was agreeable with that plan. Rx Procardia 10 mg #30, called to [facility]--advised her she could take 10 mg q 20 min prn for contractions up to 3 doses, then can take q 4 hours prn if UCs respond to serial doses--if no benefit, to be seen in MAU. Will send information to Dr. Dion Body regarding patient call.

## 2013-03-01 NOTE — MAU Provider Note (Signed)
Pt seen and evaluated.   Gen:  Appears comfortable. Abdomen: Non-tender, no fundal tenderness Cervical exam was not repeated. Fetal fibronectin negative. Responded well to IVF and one dose of Procardia 10 mg. Discharged home with PTL precautions.  Instructed to call if contractions increase.  F/u with Dr. Cole in 4 days as previously scheduled. 

## 2013-03-01 NOTE — MAU Provider Note (Signed)
Pt seen and evaluated.   Gen:  Appears comfortable. Abdomen: Non-tender, no fundal tenderness Cervical exam was not repeated. Fetal fibronectin negative. Responded well to IVF and one dose of Procardia 10 mg. Discharged home with PTL precautions.  Instructed to call if contractions increase.  F/u with Dr. Richardson Dopp in 4 days as previously scheduled.

## 2013-03-08 ENCOUNTER — Inpatient Hospital Stay (HOSPITAL_COMMUNITY)
Admission: AD | Admit: 2013-03-08 | Discharge: 2013-03-09 | Disposition: A | Discharge status: Home / Self Care | Payer: BC Managed Care – PPO | Source: Ambulatory Visit | Attending: Obstetrics and Gynecology | Admitting: Obstetrics and Gynecology

## 2013-03-08 ENCOUNTER — Encounter (HOSPITAL_COMMUNITY): Payer: Self-pay | Admitting: *Deleted

## 2013-03-08 ENCOUNTER — Telehealth: Payer: Self-pay

## 2013-03-08 DIAGNOSIS — O4700 False labor before 37 completed weeks of gestation, unspecified trimester: Secondary | ICD-10-CM | POA: Insufficient documentation

## 2013-03-08 DIAGNOSIS — O479 False labor, unspecified: Secondary | ICD-10-CM

## 2013-03-08 DIAGNOSIS — O30009 Twin pregnancy, unspecified number of placenta and unspecified number of amniotic sacs, unspecified trimester: Secondary | ICD-10-CM | POA: Insufficient documentation

## 2013-03-08 LAB — URINALYSIS, ROUTINE W REFLEX MICROSCOPIC
Bilirubin Urine: NEGATIVE
Glucose, UA: 250 mg/dL — AB
Hgb urine dipstick: NEGATIVE
Ketones, ur: NEGATIVE mg/dL
Protein, ur: NEGATIVE mg/dL

## 2013-03-08 MED ORDER — LACTATED RINGERS IV BOLUS (SEPSIS)
1000.0000 mL | Freq: Once | INTRAVENOUS | Status: AC
  Administered 2013-03-08: 1000 mL via INTRAVENOUS

## 2013-03-08 NOTE — MAU Provider Note (Signed)
History     CSN: [id number]  Arrival date and time: 03/08/13 2253   First Provider Initiated Contact with Patient 03/08/13 2334      No chief complaint on file.  HPI  Wanda Payne is a 37 y.o. G2P1001 at [redacted]w[redacted]d with twins. She took procardia 10mg  at 58, and 2120. She called and talked with [facility], and was told to come in to be seen. She had a c-section at full term with her last pregnancy, and the plan is for a repeat c-section at some point with this pregnancy. She states that her provider also plans to tocolize her until 36 weeks and attempt to stop any pre-term contractions. She denies any LOF or VB. She states that the babies have been moving normally. She states that she was last checked on 6/10 and was closed at that time. She denies any recent intercourse.   Past Medical History  Diagnosis Date  . Fibromyalgia   . Endometriosis   . MVP (mitral valve prolapse)     as teenager  . History of kidney stones   . Infertility, female   . Asthma     exercise induced as a child    Past Surgical History  Procedure Laterality Date  . Wisdom tooth extraction    . Kidney stent    . Cesarean section    . Exploratory laparotomy    . Laparoscopy      Family History  Problem Relation Age of Onset  . Hypertension Father     History  Substance Use Topics  . Smoking status: Never Smoker   . Smokeless tobacco: Not on file  . Alcohol Use: No    Allergies:  Allergies  Allergen Reactions  . Penicillins Hives    Prescriptions prior to admission  Medication Sig Dispense Refill  . acetaminophen (TYLENOL) 500 MG tablet Take 500 mg by mouth every 6 (six) hours as needed for pain.      . famotidine (PEPCID) 20 MG tablet Take 20 mg by mouth 2 (two) times daily.      . Prenatal Vit-Fe Fumarate-FA (PRENATAL MULTIVITAMIN) TABS Take 1 tablet by mouth daily.        Review of Systems  Constitutional: Negative for fever.  Eyes: Negative for blurred vision.  Respiratory: Negative  for shortness of breath.   Cardiovascular: Negative for chest pain.  Gastrointestinal: Positive for abdominal pain. Negative for nausea and vomiting. Diarrhea: "contractions"  Genitourinary: Negative for dysuria, urgency, frequency and hematuria.  Musculoskeletal: Negative for myalgias.  Neurological: Negative for headaches.   Physical Exam   Blood pressure 129/72, pulse 106, temperature 98.7 F (37.1 C), temperature source Oral, resp. rate 20, height 5\' 9"  (1.753 m), weight 107.956 kg (238 lb), last menstrual period 07/15/2012.  Physical Exam  Nursing note and vitals reviewed. Constitutional: She is oriented to person, place, and time. She appears well-developed and well-nourished. No distress.  Cardiovascular: Normal rate.   Respiratory: Effort normal.  GI: Soft. There is no tenderness.  Genitourinary:   External: no lesion Vagina: small amount of white discharge Cervix: pink, smooth. 1 at external os, closed at internal os/Thick/-2 Uterus: AGA   Neurological: She is alert and oriented to person, place, and time.  Skin: Skin is warm and dry.  Psychiatric: She has a normal mood and affect.   FHT A: 145, moderate with 15x15 accels, no decels B: 150, moderate with 15x15 accels, no decels Toco: some UI when initally on the monitor. After about 30  mins UI stopped and CTX q 8-12 mins  MAU Course  Procedures  Results for orders placed during the hospital encounter of 03/08/13 (from the past 24 hour(s))  URINALYSIS, ROUTINE W REFLEX MICROSCOPIC     Status: Abnormal   Collection Time    03/08/13 11:10 PM      Result Value Range   Color, Urine YELLOW  YELLOW   APPearance CLEAR  CLEAR   Specific Gravity, Urine 1.010  1.005 - 1.030   pH 6.0  5.0 - 8.0   Glucose, UA 250 (*) NEGATIVE mg/dL   Hgb urine dipstick NEGATIVE  NEGATIVE   Bilirubin Urine NEGATIVE  NEGATIVE   Ketones, ur NEGATIVE  NEGATIVE mg/dL   Protein, ur NEGATIVE  NEGATIVE mg/dL   Urobilinogen, UA 0.2  0.0 - 1.0  mg/dL   Nitrite NEGATIVE  NEGATIVE   Leukocytes, UA NEGATIVE  NEGATIVE   0005: Spoke with Dr. Normand Sloop, patient is OK for dc home. Plan to FU with Dr. Richardson Dopp as scheduled. Continue to take procardia as directed.   Assessment and Plan   1. Labor, false (Braxton-Hicks), antepartum    Preterm labor danger signs reviewed Continue procardia as directed Return to MAU as needed FU with Dr. Richardson Dopp as scheduled.   Tawnya Crook 03/08/2013, 11:35 PM

## 2013-03-08 NOTE — Telephone Encounter (Signed)
TC from pt; [redacted]w[redacted]d w/ twins.  Ctxs more uncomfortable this evening than usual.  Took 10mg  po procardia at 1915, and they spaced out.  Started again after got up from resting, and took a 2nd dose of procardia around 2115, and hasn't helped this time.  Instructed pt to come to MAU for eval.  Pt reports cx closed at last exam.  Also states FFN negative on 02/26/13.

## 2013-03-09 DIAGNOSIS — O4700 False labor before 37 completed weeks of gestation, unspecified trimester: Secondary | ICD-10-CM

## 2013-03-09 DIAGNOSIS — O30009 Twin pregnancy, unspecified number of placenta and unspecified number of amniotic sacs, unspecified trimester: Secondary | ICD-10-CM

## 2013-03-31 ENCOUNTER — Encounter (HOSPITAL_COMMUNITY): Payer: Self-pay | Admitting: Pharmacist

## 2013-04-05 ENCOUNTER — Other Ambulatory Visit: Payer: Self-pay | Admitting: Obstetrics and Gynecology

## 2013-04-06 ENCOUNTER — Encounter (HOSPITAL_COMMUNITY): Payer: Self-pay

## 2013-04-06 ENCOUNTER — Encounter (HOSPITAL_COMMUNITY)
Admission: RE | Admit: 2013-04-06 | Discharge: 2013-04-06 | Disposition: A | Discharge status: Home / Self Care | Payer: BC Managed Care – PPO | Source: Ambulatory Visit | Attending: Obstetrics and Gynecology | Admitting: Obstetrics and Gynecology

## 2013-04-06 LAB — CBC
HCT: 35.6 % — ABNORMAL LOW (ref 36.0–46.0)
MCHC: 34.6 g/dL (ref 30.0–36.0)
MCV: 86.6 fL (ref 78.0–100.0)
RDW: 14.4 % (ref 11.5–15.5)

## 2013-04-06 MED ORDER — GENTAMICIN SULFATE 40 MG/ML IJ SOLN
INTRAVENOUS | Status: AC
  Administered 2013-04-07: 100 mL via INTRAVENOUS
  Filled 2013-04-06: qty 9

## 2013-04-06 NOTE — Patient Instructions (Addendum)
20 Wanda Payne  04/06/2013   Your procedure is scheduled on:  04/07/13  Enter through the Main Entrance of [facility] at 12:00PM  Pick up the phone at the desk and dial [phone].   Call this number if you have problems the morning of surgery: [phone]   Remember:   Do not eat food:After Midnight.  Do not drink clear liquids: 4 Hours before arrival.  Take these medicines the morning of surgery with A SIP OF WATER: Pepcid   Do not wear jewelry, make-up or nail polish.  Do not wear lotions, powders, or perfumes. You may wear deodorant.  Do not shave 48 hours prior to surgery.  Do not bring valuables to the hospital.  [facility] is not responsible                  for any belongings or valuables brought to the hospital.  Contacts, dentures or bridgework may not be worn into surgery.  Leave suitcase in the car. After surgery it may be brought to your room.  For patients admitted to the hospital, checkout time is 11:00 AM the day of                discharge.   Patients discharged the day of surgery will not be allowed to drive                   home.  Name and phone number of your driver: NA  Special Instructions: Shower using CHG 2 nights before surgery and the night before surgery.  If you shower the day of surgery use CHG.  Use special wash - you have one bottle of CHG for all showers.  You should use approximately 1/3 of the bottle for each shower.   Please read over the following fact sheets that you were given: Surgical Site Infection Prevention

## 2013-04-07 ENCOUNTER — Encounter (HOSPITAL_COMMUNITY): Payer: Self-pay | Admitting: Anesthesiology

## 2013-04-07 ENCOUNTER — Inpatient Hospital Stay (HOSPITAL_COMMUNITY): Payer: BC Managed Care – PPO | Admitting: Anesthesiology

## 2013-04-07 ENCOUNTER — Other Ambulatory Visit: Payer: Self-pay | Admitting: Obstetrics and Gynecology

## 2013-04-07 ENCOUNTER — Encounter (HOSPITAL_COMMUNITY)
Admission: RE | Disposition: A | Discharge status: Home / Self Care | Payer: Self-pay | Source: Ambulatory Visit | Attending: Obstetrics and Gynecology

## 2013-04-07 ENCOUNTER — Inpatient Hospital Stay (HOSPITAL_COMMUNITY)
Admission: RE | Admit: 2013-04-07 | Discharge: 2013-04-10 | DRG: 371 | Disposition: A | Discharge status: Home / Self Care | Payer: BC Managed Care – PPO | Source: Ambulatory Visit | Attending: Obstetrics and Gynecology | Admitting: Obstetrics and Gynecology

## 2013-04-07 DIAGNOSIS — O30009 Twin pregnancy, unspecified number of placenta and unspecified number of amniotic sacs, unspecified trimester: Secondary | ICD-10-CM

## 2013-04-07 DIAGNOSIS — O3090 Multiple gestation, unspecified, unspecified trimester: Secondary | ICD-10-CM | POA: Diagnosis present

## 2013-04-07 DIAGNOSIS — Z98891 History of uterine scar from previous surgery: Secondary | ICD-10-CM

## 2013-04-07 DIAGNOSIS — O34219 Maternal care for unspecified type scar from previous cesarean delivery: Principal | ICD-10-CM | POA: Diagnosis present

## 2013-04-07 LAB — PREPARE RBC (CROSSMATCH)

## 2013-04-07 SURGERY — Surgical Case
Anesthesia: Spinal | Wound class: Clean Contaminated

## 2013-04-07 MED ORDER — DIPHENHYDRAMINE HCL 50 MG/ML IJ SOLN: 12.5000 mg | Freq: Four times a day (QID) | INTRAMUSCULAR | Status: DC | PRN

## 2013-04-07 MED ORDER — METHYLERGONOVINE MALEATE 0.2 MG/ML IJ SOLN: 0.2000 mg | INTRAMUSCULAR | Status: DC | PRN

## 2013-04-07 MED ORDER — METHYLERGONOVINE MALEATE 0.2 MG/ML IJ SOLN
INTRAMUSCULAR | Status: DC | PRN
  Administered 2013-04-07: 0.2 mg via INTRAMUSCULAR

## 2013-04-07 MED ORDER — SODIUM CHLORIDE 0.9 % IJ SOLN: 9.0000 mL | INTRAMUSCULAR | Status: DC | PRN

## 2013-04-07 MED ORDER — MEPERIDINE HCL 25 MG/ML IJ SOLN: 6.2500 mg | INTRAMUSCULAR | Status: DC | PRN

## 2013-04-07 MED ORDER — ONDANSETRON HCL 4 MG/2ML IJ SOLN
INTRAMUSCULAR | Status: AC
  Filled 2013-04-07: qty 2

## 2013-04-07 MED ORDER — LACTATED RINGERS IV SOLN
INTRAVENOUS | Status: DC
Start: 2013-04-07 — End: 2013-04-10
  Administered 2013-04-08: 02:00:00 via INTRAVENOUS

## 2013-04-07 MED ORDER — ONDANSETRON HCL 4 MG/2ML IJ SOLN: 4.0000 mg | Freq: Three times a day (TID) | INTRAMUSCULAR | Status: DC | PRN

## 2013-04-07 MED ORDER — ZOLPIDEM TARTRATE 5 MG PO TABS: 5.0000 mg | ORAL_TABLET | Freq: Every evening | ORAL | Status: DC | PRN

## 2013-04-07 MED ORDER — OXYTOCIN 10 UNIT/ML IJ SOLN
INTRAMUSCULAR | Status: AC
  Filled 2013-04-07: qty 4

## 2013-04-07 MED ORDER — DIPHENHYDRAMINE HCL 50 MG/ML IJ SOLN: 25.0000 mg | INTRAMUSCULAR | Status: DC | PRN

## 2013-04-07 MED ORDER — BUPIVACAINE IN DEXTROSE 0.75-8.25 % IT SOLN
INTRATHECAL | Status: DC | PRN
  Administered 2013-04-07: 10.5 mg via INTRATHECAL

## 2013-04-07 MED ORDER — PRENATAL MULTIVITAMIN CH
1.0000 | ORAL_TABLET | Freq: Every day | ORAL | Status: DC
  Administered 2013-04-08 – 2013-04-09 (×2): 1 via ORAL
  Filled 2013-04-07 (×2): qty 1

## 2013-04-07 MED ORDER — LACTATED RINGERS IV SOLN: INTRAVENOUS | Status: DC | PRN

## 2013-04-07 MED ORDER — PHENYLEPHRINE 40 MCG/ML (10ML) SYRINGE FOR IV PUSH (FOR BLOOD PRESSURE SUPPORT)
PREFILLED_SYRINGE | INTRAVENOUS | Status: AC
  Filled 2013-04-07: qty 10

## 2013-04-07 MED ORDER — METHYLERGONOVINE MALEATE 0.2 MG PO TABS: 0.2000 mg | ORAL_TABLET | ORAL | Status: DC | PRN

## 2013-04-07 MED ORDER — OXYCODONE-ACETAMINOPHEN 5-325 MG PO TABS
1.0000 | ORAL_TABLET | ORAL | Status: DC | PRN
  Administered 2013-04-08 (×4): 2 via ORAL
  Filled 2013-04-07 (×4): qty 2

## 2013-04-07 MED ORDER — DIPHENHYDRAMINE HCL 12.5 MG/5ML PO ELIX
12.5000 mg | ORAL_SOLUTION | Freq: Four times a day (QID) | ORAL | Status: DC | PRN
  Filled 2013-04-07: qty 5

## 2013-04-07 MED ORDER — IBUPROFEN 600 MG PO TABS
600.0000 mg | ORAL_TABLET | Freq: Four times a day (QID) | ORAL | Status: DC
  Administered 2013-04-08 – 2013-04-10 (×9): 600 mg via ORAL
  Filled 2013-04-07 (×9): qty 1

## 2013-04-07 MED ORDER — NALOXONE HCL 0.4 MG/ML IJ SOLN: 0.4000 mg | INTRAMUSCULAR | Status: DC | PRN

## 2013-04-07 MED ORDER — FERROUS SULFATE 325 (65 FE) MG PO TABS
325.0000 mg | ORAL_TABLET | Freq: Two times a day (BID) | ORAL | Status: DC
  Administered 2013-04-08 – 2013-04-10 (×5): 325 mg via ORAL
  Filled 2013-04-07 (×6): qty 1

## 2013-04-07 MED ORDER — LANOLIN HYDROUS EX OINT: 1.0000 "application " | TOPICAL_OINTMENT | CUTANEOUS | Status: DC | PRN

## 2013-04-07 MED ORDER — MEPERIDINE HCL 25 MG/ML IJ SOLN
INTRAMUSCULAR | Status: AC
  Administered 2013-04-07: 12.5 mg via INTRAVENOUS
  Filled 2013-04-07: qty 1

## 2013-04-07 MED ORDER — HYDROMORPHONE HCL PF 1 MG/ML IJ SOLN
1.0000 mg | Freq: Once | INTRAMUSCULAR | Status: AC
  Administered 2013-04-07: 1 mg via INTRAVENOUS
  Filled 2013-04-07: qty 1

## 2013-04-07 MED ORDER — METOCLOPRAMIDE HCL 5 MG/ML IJ SOLN: 10.0000 mg | Freq: Three times a day (TID) | INTRAMUSCULAR | Status: DC | PRN

## 2013-04-07 MED ORDER — SODIUM CHLORIDE 0.9 % IJ SOLN: 3.0000 mL | INTRAMUSCULAR | Status: DC | PRN

## 2013-04-07 MED ORDER — SIMETHICONE 80 MG PO CHEW
80.0000 mg | CHEWABLE_TABLET | Freq: Three times a day (TID) | ORAL | Status: DC
  Administered 2013-04-07 – 2013-04-09 (×9): 80 mg via ORAL

## 2013-04-07 MED ORDER — KETOROLAC TROMETHAMINE 30 MG/ML IJ SOLN: 30.0000 mg | Freq: Four times a day (QID) | INTRAMUSCULAR | Status: AC | PRN

## 2013-04-07 MED ORDER — ONDANSETRON HCL 4 MG/2ML IJ SOLN: 4.0000 mg | INTRAMUSCULAR | Status: DC | PRN

## 2013-04-07 MED ORDER — ONDANSETRON HCL 4 MG/2ML IJ SOLN: 4.0000 mg | Freq: Four times a day (QID) | INTRAMUSCULAR | Status: DC | PRN

## 2013-04-07 MED ORDER — PHENYLEPHRINE 40 MCG/ML (10ML) SYRINGE FOR IV PUSH (FOR BLOOD PRESSURE SUPPORT)
PREFILLED_SYRINGE | INTRAVENOUS | Status: AC
  Filled 2013-04-07: qty 5

## 2013-04-07 MED ORDER — MEPERIDINE HCL 25 MG/ML IJ SOLN
INTRAMUSCULAR | Status: DC | PRN
  Administered 2013-04-07: 12.5 mg via INTRAVENOUS

## 2013-04-07 MED ORDER — SCOPOLAMINE 1 MG/3DAYS TD PT72: 1.0000 | MEDICATED_PATCH | Freq: Once | TRANSDERMAL | Status: DC

## 2013-04-07 MED ORDER — NALBUPHINE HCL 10 MG/ML IJ SOLN: 5.0000 mg | INTRAMUSCULAR | Status: DC | PRN

## 2013-04-07 MED ORDER — HYDROMORPHONE HCL PF 1 MG/ML IJ SOLN
INTRAMUSCULAR | Status: AC
  Administered 2013-04-07: 0.5 mg via INTRAVENOUS
  Filled 2013-04-07: qty 1

## 2013-04-07 MED ORDER — MENTHOL 3 MG MT LOZG: 1.0000 | LOZENGE | OROMUCOSAL | Status: DC | PRN

## 2013-04-07 MED ORDER — OXYTOCIN 40 UNITS IN LACTATED RINGERS INFUSION - SIMPLE MED: 62.5000 mL/h | INTRAVENOUS | Status: AC

## 2013-04-07 MED ORDER — SCOPOLAMINE 1 MG/3DAYS TD PT72
MEDICATED_PATCH | TRANSDERMAL | Status: AC
  Administered 2013-04-07: 1.5 mg via TRANSDERMAL
  Filled 2013-04-07: qty 1

## 2013-04-07 MED ORDER — DEXTROSE 5 % IV SOLN: 1.0000 ug/kg/h | INTRAVENOUS | Status: DC | PRN

## 2013-04-07 MED ORDER — WITCH HAZEL-GLYCERIN EX PADS: 1.0000 "application " | MEDICATED_PAD | CUTANEOUS | Status: DC | PRN

## 2013-04-07 MED ORDER — FENTANYL CITRATE 0.05 MG/ML IJ SOLN
INTRAMUSCULAR | Status: AC
  Filled 2013-04-07: qty 2

## 2013-04-07 MED ORDER — DIPHENHYDRAMINE HCL 25 MG PO CAPS: 25.0000 mg | ORAL_CAPSULE | Freq: Four times a day (QID) | ORAL | Status: DC | PRN

## 2013-04-07 MED ORDER — LACTATED RINGERS IV SOLN
INTRAVENOUS | Status: DC | PRN
  Administered 2013-04-07 (×4): via INTRAVENOUS

## 2013-04-07 MED ORDER — PHENYLEPHRINE HCL 10 MG/ML IJ SOLN
INTRAMUSCULAR | Status: DC | PRN
  Administered 2013-04-07 (×3): 40 ug via INTRAVENOUS
  Administered 2013-04-07 (×2): 80 ug via INTRAVENOUS
  Administered 2013-04-07 (×4): 40 ug via INTRAVENOUS
  Administered 2013-04-07: 120 ug via INTRAVENOUS
  Administered 2013-04-07: 40 ug via INTRAVENOUS

## 2013-04-07 MED ORDER — DIPHENHYDRAMINE HCL 25 MG PO CAPS: 25.0000 mg | ORAL_CAPSULE | ORAL | Status: DC | PRN

## 2013-04-07 MED ORDER — LACTATED RINGERS IV SOLN
Freq: Once | INTRAVENOUS | Status: AC
  Administered 2013-04-07: 12:00:00 via INTRAVENOUS

## 2013-04-07 MED ORDER — SCOPOLAMINE 1 MG/3DAYS TD PT72
MEDICATED_PATCH | TRANSDERMAL | Status: AC
  Filled 2013-04-07: qty 1

## 2013-04-07 MED ORDER — MEPERIDINE HCL 25 MG/ML IJ SOLN
INTRAMUSCULAR | Status: AC
  Filled 2013-04-07: qty 1

## 2013-04-07 MED ORDER — METHYLERGONOVINE MALEATE 0.2 MG/ML IJ SOLN
INTRAMUSCULAR | Status: AC
  Filled 2013-04-07: qty 1

## 2013-04-07 MED ORDER — KETOROLAC TROMETHAMINE 60 MG/2ML IM SOLN: 60.0000 mg | Freq: Once | INTRAMUSCULAR | Status: AC | PRN

## 2013-04-07 MED ORDER — ONDANSETRON HCL 4 MG PO TABS: 4.0000 mg | ORAL_TABLET | ORAL | Status: DC | PRN

## 2013-04-07 MED ORDER — PROMETHAZINE HCL 25 MG/ML IJ SOLN: 6.2500 mg | INTRAMUSCULAR | Status: DC | PRN

## 2013-04-07 MED ORDER — DIPHENHYDRAMINE HCL 50 MG/ML IJ SOLN: 12.5000 mg | INTRAMUSCULAR | Status: DC | PRN

## 2013-04-07 MED ORDER — DIBUCAINE 1 % RE OINT: 1.0000 "application " | TOPICAL_OINTMENT | RECTAL | Status: DC | PRN

## 2013-04-07 MED ORDER — MORPHINE SULFATE (PF) 1 MG/ML IV SOLN: INTRAVENOUS | Status: DC

## 2013-04-07 MED ORDER — HYDROMORPHONE HCL PF 1 MG/ML IJ SOLN
0.2500 mg | INTRAMUSCULAR | Status: DC | PRN
  Administered 2013-04-07: 0.5 mg via INTRAVENOUS

## 2013-04-07 MED ORDER — KETOROLAC TROMETHAMINE 30 MG/ML IJ SOLN
30.0000 mg | Freq: Four times a day (QID) | INTRAMUSCULAR | Status: AC | PRN
  Administered 2013-04-08: 30 mg via INTRAVENOUS
  Filled 2013-04-07: qty 1

## 2013-04-07 MED ORDER — OXYTOCIN 10 UNIT/ML IJ SOLN
40.0000 [IU] | INTRAVENOUS | Status: DC | PRN
  Administered 2013-04-07: 40 [IU] via INTRAVENOUS

## 2013-04-07 MED ORDER — KETOROLAC TROMETHAMINE 30 MG/ML IJ SOLN
INTRAMUSCULAR | Status: AC
  Administered 2013-04-07: 30 mg via INTRAMUSCULAR
  Filled 2013-04-07: qty 1

## 2013-04-07 MED ORDER — FENTANYL CITRATE 0.05 MG/ML IJ SOLN
INTRAMUSCULAR | Status: DC | PRN
  Administered 2013-04-07: 25 ug via INTRATHECAL

## 2013-04-07 MED ORDER — HYDROMORPHONE 0.3 MG/ML IV SOLN
INTRAVENOUS | Status: DC
  Administered 2013-04-07: 1.2 mg via INTRAVENOUS
  Administered 2013-04-07: 20:00:00 via INTRAVENOUS
  Administered 2013-04-08: 0.9 mg via INTRAVENOUS
  Administered 2013-04-08: 1.2 mg via INTRAVENOUS
  Filled 2013-04-07: qty 25

## 2013-04-07 MED ORDER — SIMETHICONE 80 MG PO CHEW
80.0000 mg | CHEWABLE_TABLET | ORAL | Status: DC | PRN
  Administered 2013-04-09: 80 mg via ORAL

## 2013-04-07 MED ORDER — SENNOSIDES-DOCUSATE SODIUM 8.6-50 MG PO TABS
2.0000 | ORAL_TABLET | Freq: Every day | ORAL | Status: DC
  Administered 2013-04-07 – 2013-04-09 (×3): 2 via ORAL

## 2013-04-07 MED ORDER — KETOROLAC TROMETHAMINE 30 MG/ML IJ SOLN: 15.0000 mg | Freq: Once | INTRAMUSCULAR | Status: DC | PRN

## 2013-04-07 MED ORDER — 0.9 % SODIUM CHLORIDE (POUR BTL) OPTIME
TOPICAL | Status: DC | PRN
  Administered 2013-04-07: 1000 mL

## 2013-04-07 SURGICAL SUPPLY — 44 items
APL SKNCLS STERI-STRIP NONHPOA (GAUZE/BANDAGES/DRESSINGS) ×3
BARRIER ADHS 3X4 INTERCEED (GAUZE/BANDAGES/DRESSINGS) ×1 IMPLANT
BENZOIN TINCTURE PRP APPL 2/3 (GAUZE/BANDAGES/DRESSINGS) ×4 IMPLANT
BRR ADH 4X3 ABS CNTRL BYND (GAUZE/BANDAGES/DRESSINGS) ×1
CLAMP CORD UMBIL (MISCELLANEOUS) ×1 IMPLANT
CLOTH BEACON ORANGE TIMEOUT ST (SAFETY) ×2 IMPLANT
CONTAINER PREFILL 10% NBF 15ML (MISCELLANEOUS) IMPLANT
DRAPE LG THREE QUARTER DISP (DRAPES) ×2 IMPLANT
DRSG OPSITE POSTOP 4X10 (GAUZE/BANDAGES/DRESSINGS) ×2 IMPLANT
DURAPREP 26ML APPLICATOR (WOUND CARE) ×2 IMPLANT
ELECT REM PT RETURN 9FT ADLT (ELECTROSURGICAL) ×2
ELECTRODE REM PT RTRN 9FT ADLT (ELECTROSURGICAL) ×1 IMPLANT
EXTRACTOR VACUUM M CUP 4 TUBE (SUCTIONS) IMPLANT
GLOVE BIOGEL M 6.5 STRL (GLOVE) ×4 IMPLANT
GLOVE BIOGEL PI IND STRL 6.5 (GLOVE) ×1 IMPLANT
GLOVE BIOGEL PI INDICATOR 6.5 (GLOVE) ×1
GOWN PREVENTION PLUS XLARGE (GOWN DISPOSABLE) ×2 IMPLANT
GOWN STRL REIN XL XLG (GOWN DISPOSABLE) ×4 IMPLANT
KIT ABG SYR 3ML LUER SLIP (SYRINGE) IMPLANT
NDL HYPO 25X5/8 SAFETYGLIDE (NEEDLE) IMPLANT
NEEDLE HYPO 25X5/8 SAFETYGLIDE (NEEDLE) IMPLANT
NS IRRIG 1000ML POUR BTL (IV SOLUTION) ×2 IMPLANT
PACK C SECTION WH (CUSTOM PROCEDURE TRAY) ×2 IMPLANT
PAD ABD 7.5X8 STRL (GAUZE/BANDAGES/DRESSINGS) ×1 IMPLANT
PAD OB MATERNITY 4.3X12.25 (PERSONAL CARE ITEMS) ×2 IMPLANT
RTRCTR C-SECT PINK 25CM LRG (MISCELLANEOUS) ×1 IMPLANT
RTRCTR C-SECT PINK 34CM XLRG (MISCELLANEOUS) IMPLANT
STAPLER VISISTAT 35W (STAPLE) IMPLANT
STRIP CLOSURE SKIN 1/2X4 (GAUZE/BANDAGES/DRESSINGS) ×2 IMPLANT
SUT PDS AB 0 CT1 27 (SUTURE) ×4 IMPLANT
SUT PLAIN 0 NONE (SUTURE) IMPLANT
SUT PLAIN 2 0 (SUTURE) ×2
SUT PLAIN ABS 2-0 CT1 27XMFL (SUTURE) IMPLANT
SUT VIC AB 0 CTX 36 (SUTURE) ×8
SUT VIC AB 0 CTX36XBRD ANBCTRL (SUTURE) ×3 IMPLANT
SUT VIC AB 2-0 CT1 27 (SUTURE) ×2
SUT VIC AB 2-0 CT1 TAPERPNT 27 (SUTURE) ×1 IMPLANT
SUT VIC AB 3-0 SH 27 (SUTURE) ×6
SUT VIC AB 3-0 SH 27X BRD (SUTURE) IMPLANT
SUT VIC AB 4-0 KS 27 (SUTURE) ×2 IMPLANT
TAPE CLOTH SURG 4X10 WHT LF (GAUZE/BANDAGES/DRESSINGS) ×1 IMPLANT
TOWEL OR 17X24 6PK STRL BLUE (TOWEL DISPOSABLE) ×6 IMPLANT
TRAY FOLEY CATH 14FR (SET/KITS/TRAYS/PACK) ×2 IMPLANT
WATER STERILE IRR 1000ML POUR (IV SOLUTION) ×2 IMPLANT

## 2013-04-07 NOTE — Anesthesia Preprocedure Evaluation (Signed)
Anesthesia Evaluation  Patient identified by MRN, date of birth, ID band Patient awake    Reviewed: Allergy & Precautions, H&P , NPO status , Patient's Chart, lab work & pertinent test results  Airway Mallampati: II TM Distance: >3 FB Neck ROM: full    Dental no notable dental hx.    Pulmonary    Pulmonary exam normal       Cardiovascular negative cardio ROS      Neuro/Psych negative psych ROS   GI/Hepatic negative GI ROS, Neg liver ROS,   Endo/Other  negative endocrine ROS  Renal/GU negative Renal ROS     Musculoskeletal   Abdominal Normal abdominal exam  (+)   Peds negative pediatric ROS (+)  Hematology negative hematology ROS (+)   Anesthesia Other Findings   Reproductive/Obstetrics (+) Pregnancy                           Anesthesia Physical Anesthesia Plan  ASA: II  Anesthesia Plan: Spinal   Post-op Pain Management:    Induction:   Airway Management Planned:   Additional Equipment:   Intra-op Plan:   Post-operative Plan:   Informed Consent: I have reviewed the patients History and Physical, chart, labs and discussed the procedure including the risks, benefits and alternatives for the proposed anesthesia with the patient or authorized representative who has indicated his/her understanding and acceptance.     Plan Discussed with: CRNA and Surgeon  Anesthesia Plan Comments:         Anesthesia Quick Evaluation

## 2013-04-07 NOTE — Anesthesia Procedure Notes (Signed)
Spinal  Patient location during procedure: OR Start time: 04/07/2013 1:26 PM End time: 04/07/2013 1:28 PM Staffing Anesthesiologist: Sandrea Hughs Performed by: anesthesiologist  Preanesthetic Checklist Completed: patient identified, surgical consent, pre-op evaluation, timeout performed, IV checked, risks and benefits discussed and monitors and equipment checked Spinal Block Patient position: sitting Prep: DuraPrep Patient monitoring: heart rate, cardiac monitor, continuous pulse ox and blood pressure Approach: midline Location: L3-4 Injection technique: single-shot Needle Needle type: Sprotte  Needle gauge: 24 G Needle length: 9 cm Needle insertion depth: 5 cm Assessment Sensory level: T4

## 2013-04-07 NOTE — Anesthesia Postprocedure Evaluation (Signed)
  Anesthesia Post Note  Patient: Wanda Payne  Procedure(s) Performed: Procedure(s) (LRB): CESAREAN SECTION/TWINS (N/A)  Anesthesia type: Spinal  Patient location: PACU  Post pain: Pain level controlled  Post assessment: Post-op Vital signs reviewed  Last Vitals:  Filed Vitals:   04/07/13 1530  BP: 108/76  Pulse: 87  Temp:   Resp: 17    Post vital signs: Reviewed  Level of consciousness: awake  Complications: No apparent anesthesia complications

## 2013-04-07 NOTE — Op Note (Signed)
Cesarean Section Procedure Note  Indications:  h/o cesarean section desires repeat.. current twin gestation   Pre-operative Diagnosis: 38 week 0 day pregnancy.  Post-operative Diagnosis: same  Surgeon: Jessee Avers.   Assistants: Dr. Geryl Rankins   Anesthesia: Spinal anesthesia  ASA Class: 2   Procedure Details   The patient was seen in the Holding Room. The risks, benefits, complications, treatment options, and expected outcomes were discussed with the patient.  The patient concurred with the proposed plan, giving informed consent.  The site of surgery properly noted/marked. The patient was taken to Operating Room # 9, identified as Wanda Payne and the procedure verified as C-Section Delivery. A Time Out was held and the above information confirmed.  After induction of anesthesia, the patient was draped and prepped in the usual sterile manner. A Pfannenstiel incision was made and carried down through the subcutaneous tissue to the fascia. Fascial incision was made and extended transversely. The fascia was separated from the underlying rectus tissue superiorly and inferiorly. The peritoneum was identified and entered. Peritoneal incision was extended longitudinally. The utero-vesical peritoneal reflection was incised transversely and the bladder flap was bluntly freed from the lower uterine segment. A low transverse uterine incision was made. Delivered from cephalic presentation was Baby A  a Female with Apgar scores of 9 at one minute and 9 at five minutes. the umbilical cord was clamped and cut. The membranes of Baby B were ruptured. Baby B was a female delivered from breech Presentation with Apgars of 9 at one minute and 9 at five minutes.  cord blood was obtained for evaluation. The placenta was removed intact and appeared normal. The uterine outline, tubes and ovaries appeared normal. 0.2 mg of Methergine was injected into the uterine fundus to prevent atony.  The uterine incision was  closed with running locked sutures of 0 vicryl a second layer of 0 vicryl was used to imbricate the incision. Hemostasis was observed. Lavage was carried out until clear. The peritoneum was closed with 2-0 vicryl.  The fascia was then reapproximated with running sutures of 0 pds. The skin was reapproximated with 4-0 vicryl .  Instrument, sponge, and needle counts were correct prior the abdominal closure and at the conclusion of the case.   Findings:  Baby A was a female infant in the cephalic presentation. Baby B was a female infant in the breech presentation.   Estimated Blood Loss:  1200 ml         Drains: foley          Total IV Fluids:  Per anesthesia ml         Specimens: Placenta and to labor and delivery            Implants: None         Complications:  None; patient tolerated the procedure well.         Disposition: PACU - hemodynamically stable.         Condition: stable  Attending Attestation: I performed the procedure.

## 2013-04-07 NOTE — OR Nursing (Signed)
04/07/2013 Interceed 3" X 4" used intraoperatively by Dr Richardson Dopp.

## 2013-04-07 NOTE — Progress Notes (Signed)
In to assess pt for pain management. Pt declines Morphine PCA due to previous h/o severe itching.  Verbal order previously given for Dilaudid 1 mg IV.  RN waiting for pharmacy.  Pt reports pain is 8/10.  Lying still and is not in acute distress.  Full dose Dilaudid PCA ordered, Morphine PCA cancelled.  Plan d/w pt and family.

## 2013-04-07 NOTE — Transfer of Care (Signed)
Immediate Anesthesia Transfer of Care Note  Patient: Wanda Payne  Procedure(s) Performed: Procedure(s): CESAREAN SECTION/TWINS (N/A)  Patient Location: PACU  Anesthesia Type:Spinal  Level of Consciousness: awake, alert  and oriented  Airway & Oxygen Therapy: Patient Spontanous Breathing  Post-op Assessment: Report given to PACU RN and Post -op Vital signs reviewed and stable  Post vital signs: stable  Complications: No apparent anesthesia complications

## 2013-04-07 NOTE — H&P (Signed)
Wanda Payne is a 37 y.o. female G2P1001   At [redacted]wks ega presenting for  Repeat cesarean section. This is a twin pregnancy that has been uncomplicated.  +_ FM  No lof no vaginal bleeding. Irregular contractions.     History OB History   Grav Para Term Preterm Abortions TAB SAB Ect Mult Living   2 1 1       1      Past Medical History  Diagnosis Date  . Fibromyalgia   . Endometriosis   . MVP (mitral valve prolapse)     as teenager  . History of kidney stones   . Infertility, female   . Asthma     exercise induced as a child   Past Surgical History  Procedure Laterality Date  . Wisdom tooth extraction    . Kidney stent    . Cesarean section    . Exploratory laparotomy    . Laparoscopy     Family History: family history includes Hypertension in her father. Social History:  reports that she has never smoked. She does not have any smokeless tobacco history on file. She reports that she does not drink alcohol or use illicit drugs.   Prenatal Transfer Tool  Maternal Diabetes: No Genetic Screening: Declined Maternal Ultrasounds/Referrals: Normal Fetal Ultrasounds or other Referrals:  None Maternal Substance Abuse:  No Significant Maternal Medications:  None Significant Maternal Lab Results:  Lab values include: Group B Strep negative Other Comments:  twin gestation   Review of Systems  All other systems reviewed and are negative.      Last menstrual period 07/15/2012. Maternal Exam:  Abdomen: Surgical scars: low transverse.   Fundal height is twins .    Introitus: Normal vulva. Normal vagina.   AF VSS Physical Exam  Constitutional: She is oriented to person, place, and time.  HENT:  Head: Normocephalic.  Cardiovascular: Normal rate and regular rhythm.   Respiratory: Effort normal and breath sounds normal.  GI: There is no tenderness. There is no rebound and no guarding.  Genitourinary: Vagina normal.  Musculoskeletal: She exhibits edema. She exhibits no  tenderness.  Neurological: She is alert and oriented to person, place, and time.  Skin: Skin is warm and dry.  Psychiatric: She has a normal mood and affect.    Prenatal labs: ABO, Rh: --/--/B POS (07/15 1100) Antibody: NEG (07/15 1100) Rubella: Immune (12/27 0000) RPR: NON REACTIVE (07/15 1100)  HBsAg: Negative (12/27 0000)  HIV: Non-reactive (12/27 0000)  GBS:   Negative   Assessment/Plan:  38 wks with twin gestation h/o cesarean section. Desires repeat.  R/b/a of  Cesarean section discussed with patient including infection/ bleeding/ damage to bowel bladder baby with the need for further surgery. R/O transfusion HIV/ Hep B&C discussed. Pt voiced understanding and desires to proceed with cesarean section.   Mandela Bello J. 04/07/2013, 8:41 AM

## 2013-04-08 ENCOUNTER — Encounter (HOSPITAL_COMMUNITY): Payer: Self-pay | Admitting: Obstetrics and Gynecology

## 2013-04-08 DIAGNOSIS — Z98891 History of uterine scar from previous surgery: Secondary | ICD-10-CM

## 2013-04-08 LAB — CBC
HCT: 28.7 % — ABNORMAL LOW (ref 36.0–46.0)
MCV: 86.4 fL (ref 78.0–100.0)
Platelets: 191 10*3/uL (ref 150–400)
RBC: 3.32 MIL/uL — ABNORMAL LOW (ref 3.87–5.11)
WBC: 11.5 10*3/uL — ABNORMAL HIGH (ref 4.0–10.5)

## 2013-04-08 NOTE — Progress Notes (Signed)
Subjective: Postpartum Day 1: Cesarean Delivery Twins  Patient reports tolerating PO.    Objective: Vital signs in last 24 hours: Temp:  [97.5 F (36.4 C)-98.4 F (36.9 C)] 98.4 F (36.9 C) (07/17 0615) Pulse Rate:  [78-120] 87 (07/17 0615) Resp:  [17-20] 18 (07/17 0615) BP: (108-136)/(55-88) 119/69 mmHg (07/17 0615) SpO2:  [94 %-100 %] 94 % (07/17 0615) Weight:  [100.971 kg (222 lb 9.6 oz)] 100.971 kg (222 lb 9.6 oz) (07/16 1700)  Physical Exam:  General: alert and cooperative Lochia: appropriate Uterine Fundus: firm Incision: healing well DVT Evaluation: No evidence of DVT seen on physical exam.   Recent Labs  04/06/13 1100 04/08/13 0620  HGB 12.3 9.9*  HCT 35.6* 28.7*    Assessment/Plan: Status post Cesarean section. Doing well postoperatively.  Continue current care.  Brace Welte J. 04/08/2013, 8:46 AM

## 2013-04-09 MED ORDER — HYDROCODONE-ACETAMINOPHEN 5-325 MG PO TABS
1.0000 | ORAL_TABLET | ORAL | Status: DC | PRN
  Administered 2013-04-09 – 2013-04-10 (×6): 2 via ORAL
  Administered 2013-04-10 (×2): 1 via ORAL
  Filled 2013-04-09 (×2): qty 2
  Filled 2013-04-09: qty 1
  Filled 2013-04-09 (×4): qty 2
  Filled 2013-04-09: qty 1

## 2013-04-09 NOTE — Progress Notes (Signed)
RN called to notify of pt request to switch from PO percocet to PO vicodin, pt states to RN that vicodin usually works better for her.  Percocet dc'd  vicodin 1-2 tabs PO q4h prn ordered  [name], CNM

## 2013-04-09 NOTE — Progress Notes (Signed)
Subjective: Postpartum Day 2: Cesarean Delivery Patient reports tolerating PO, + flatus and no problems voiding.    Pain currently a 4 out of 10...  She prefers vicodin instead of percocet for pain.  Objective: Vital signs in last 24 hours: Temp:  [98.1 F (36.7 C)-99.2 F (37.3 C)] 98.1 F (36.7 C) (07/18 0630) Pulse Rate:  [89-92] 92 (07/18 0630) Resp:  [18] 18 (07/18 0630) BP: (118-126)/(66-77) 126/76 mmHg (07/18 0630) SpO2:  [95 %] 95 % (07/17 1510)  Physical Exam:  General: alert and cooperative Lochia: appropriate Uterine Fundus: firm Incision: healing well DVT Evaluation: No evidence of DVT seen on physical exam.   Recent Labs  04/08/13 0620  HGB 9.9*  HCT 28.7*    Assessment/Plan: Status post Cesarean section. Doing well postoperatively.   pt desires discharge home tomorrow with ibuprofen and vicodin.  Follow up in the office in 2 wks for incision check .  Jaequan Propes J. 04/09/2013, 12:44 PM

## 2013-04-10 LAB — TYPE AND SCREEN: Unit division: 0

## 2013-04-10 MED ORDER — FERROUS SULFATE 325 (65 FE) MG PO TABS: 325.0000 mg | ORAL_TABLET | Freq: Two times a day (BID) | ORAL | Status: DC

## 2013-04-10 MED ORDER — IBUPROFEN 600 MG PO TABS: 600.0000 mg | ORAL_TABLET | Freq: Four times a day (QID) | ORAL | Status: DC

## 2013-04-10 MED ORDER — HYDROCODONE-ACETAMINOPHEN 5-325 MG PO TABS: 1.0000 | ORAL_TABLET | ORAL | Status: DC | PRN

## 2013-04-10 NOTE — Lactation Note (Signed)
This note was copied from the chart of [facility] Lactation Consultation Note  Patient Name: Wanda Payne [id number] Date: 04/10/2013 Reason for consult: Follow-up assessment Mom with moderate engorgement of breasts this am.  Single ice pack provided for both breasts.  Gave Mom another ice pack, to use one for each breast.  Assisted Mom to latch baby boy on the right breast.  Manually expressed milk from breast and baby latched on easily.  Reminded Mom of importance of a deep latch, and checking Ivin Booty for lower lip flange.  Tugging on chin to uncurl lower lip helpful to make a better seal.  Baby Windell Moulding started fussing so helped her latch in football hold.  Support provided for her head to facilitate a deep latch.  Both babies nutritive.  Encouraged Mom to use breast massage and compression during the feeding.  After babies feed, instructed Mom to ice both breasts for 20 mins and pump both breasts for 20 mins.  Engorgement handout read to Mom and given for her to take home.  Offered her an OP lactation appointment, but Mom would rather call after getting home if she needs help.  Both babies are at 10% weight loss, milk volume increasing today, and pediatrician appointment 04/12/13.  To call for help prn.  Maternal Data    Feeding Feeding Type: Breast Milk Feeding method: Breast Nipple Type: Slow - flow  LATCH Score/Interventions Latch: Grasps breast easily, tongue down, lips flanged, rhythmical sucking. Intervention(s): Breast compression;Breast massage;Assist with latch;Adjust position  Audible Swallowing: Spontaneous and intermittent Intervention(s): Hand expression;Skin to skin Intervention(s): Hand expression;Alternate breast massage  Type of Nipple: Everted at rest and after stimulation  Comfort (Breast/Nipple): Engorged, cracked, bleeding, large blisters, severe discomfort Problem noted: Engorgment Intervention(s): Ice;Hand expression     Hold (Positioning): Assistance  needed to correctly position infant at breast and maintain latch. Intervention(s): Skin to skin;Position options;Breastfeeding basics reviewed  LATCH Score: 7  Lactation Tools Discussed/Used Tools: Pump Breast pump type: Double-Electric Breast Pump   Consult Status Consult Status: Complete Follow-up type: Call as needed    [name] 04/10/2013, 9:50 AM

## 2013-04-10 NOTE — Discharge Summary (Signed)
Cesarean Section Delivery Discharge Summary  Wanda Payne  DOB:    1975/11/01 MRN:    [id number] CSN:    [id number]  Date of admission:                  04/07/13  Date of discharge:                   04/10/13  Procedures this admission: C/S - Twins  Date of Delivery: 04/07/13 by Dr. Richardson Dopp  Newborn Data:    Wanda Payne [[id number]]  Live born female  Birth Weight: 8 lb 9.7 oz (3905 g) APGAR: 8, 9   Wanda Payne [[id number]]  Live born female  Birth Weight: 8 lb 6.6 oz (3815 g) APGAR: 8, 9  Home with mother. Circumcision:  Done in hospital  History of Present Illness:  [name] is a 37 y.o. female, G2P2003, who presents at [redacted]w[redacted]d weeks gestation. The patient has been followed at the [facility] Gynecology division of [facility].    Her pregnancy has been complicated by:  Patient Active Problem List   Diagnosis Date Noted  . Status post repeat low transverse cesarean section--twins 04/08/2013    Hospital course:  The patient was admitted for c/s for twins.   Her postpartum course was not complicated.  She was discharged to home on postpartum day 3 doing well.  Feeding:  breast  Contraception:  Declined  Discharge hemoglobin:  Hemoglobin  Date Value Range Status  04/08/2013 9.9* 12.0 - 15.0 g/dL Final     REPEATED TO VERIFY     DELTA CHECK NOTED     HCT  Date Value Range Status  04/08/2013 28.7* 36.0 - 46.0 % Final    Discharge Physical Exam:   General: alert, cooperative and no distress Lochia: appropriate Uterine Fundus: firm Incision: healing well DVT Evaluation: No evidence of DVT seen on physical exam. Negative Homan's sign.  Intrapartum Procedures: cesarean: low cervical, transverse Postpartum Procedures: none Complications-Operative and Postpartum: none  Discharge Diagnoses: Term Pregnancy-delivered and mild anemia, Twins  Discharge Information:  Activity:           pelvic  rest Diet:                routine Medications: PNV, Ibuprofen, Iron and Vicodin Condition:      stable Instructions:  refer to practice specific booklet Discharge to: home  Follow-up Information   Follow up with Jessee Avers., MD. Schedule an appointment as soon as possible for a visit in 2 weeks. (pt may already have an appointment )    Contact information:   [address] [place] [address] [phone]        Wanda Payne 04/10/2013

## 2013-04-12 ENCOUNTER — Other Ambulatory Visit (HOSPITAL_COMMUNITY): Payer: BC Managed Care – PPO

## 2013-07-12 ENCOUNTER — Other Ambulatory Visit (HOSPITAL_COMMUNITY)
Admission: RE | Admit: 2013-07-12 | Discharge: 2013-07-12 | Disposition: A | Discharge status: Home / Self Care | Payer: BC Managed Care – PPO | Source: Ambulatory Visit | Attending: Obstetrics and Gynecology | Admitting: Obstetrics and Gynecology

## 2013-07-12 ENCOUNTER — Other Ambulatory Visit: Payer: Self-pay | Admitting: Obstetrics and Gynecology

## 2013-07-12 DIAGNOSIS — R87810 Cervical high risk human papillomavirus (HPV) DNA test positive: Secondary | ICD-10-CM | POA: Insufficient documentation

## 2013-07-12 DIAGNOSIS — Z1151 Encounter for screening for human papillomavirus (HPV): Secondary | ICD-10-CM | POA: Insufficient documentation

## 2013-07-12 DIAGNOSIS — Z01419 Encounter for gynecological examination (general) (routine) without abnormal findings: Secondary | ICD-10-CM | POA: Insufficient documentation

## 2014-07-25 ENCOUNTER — Encounter (HOSPITAL_COMMUNITY): Payer: Self-pay | Admitting: Obstetrics and Gynecology

## 2014-08-26 ENCOUNTER — Other Ambulatory Visit: Payer: Self-pay | Admitting: Obstetrics and Gynecology

## 2014-08-26 ENCOUNTER — Other Ambulatory Visit (HOSPITAL_COMMUNITY)
Admission: RE | Admit: 2014-08-26 | Discharge: 2014-08-26 | Disposition: A | Discharge status: Home / Self Care | Payer: BC Managed Care – PPO | Source: Ambulatory Visit | Attending: Obstetrics and Gynecology | Admitting: Obstetrics and Gynecology

## 2014-08-26 DIAGNOSIS — Z01419 Encounter for gynecological examination (general) (routine) without abnormal findings: Secondary | ICD-10-CM | POA: Insufficient documentation

## 2014-08-26 DIAGNOSIS — N6452 Nipple discharge: Secondary | ICD-10-CM

## 2014-08-26 DIAGNOSIS — N644 Mastodynia: Secondary | ICD-10-CM

## 2014-08-29 LAB — CYTOLOGY - PAP

## 2014-09-07 ENCOUNTER — Ambulatory Visit
Admission: RE | Admit: 2014-09-07 | Discharge: 2014-09-07 | Disposition: A | Discharge status: Home / Self Care | Payer: BC Managed Care – PPO | Source: Ambulatory Visit | Attending: Obstetrics and Gynecology | Admitting: Obstetrics and Gynecology

## 2014-09-07 ENCOUNTER — Encounter (INDEPENDENT_AMBULATORY_CARE_PROVIDER_SITE_OTHER): Payer: Self-pay

## 2014-09-07 DIAGNOSIS — N644 Mastodynia: Secondary | ICD-10-CM

## 2014-09-07 DIAGNOSIS — N6452 Nipple discharge: Secondary | ICD-10-CM

## 2015-08-28 ENCOUNTER — Other Ambulatory Visit: Payer: Self-pay | Admitting: Obstetrics and Gynecology

## 2015-08-28 ENCOUNTER — Other Ambulatory Visit (HOSPITAL_COMMUNITY)
Admission: RE | Admit: 2015-08-28 | Discharge: 2015-08-28 | Disposition: A | Discharge status: Home / Self Care | Payer: BLUE CROSS/BLUE SHIELD | Source: Ambulatory Visit | Attending: Obstetrics and Gynecology | Admitting: Obstetrics and Gynecology

## 2015-08-28 DIAGNOSIS — Z01419 Encounter for gynecological examination (general) (routine) without abnormal findings: Secondary | ICD-10-CM | POA: Diagnosis not present

## 2015-08-29 LAB — CYTOLOGY - PAP

## 2016-09-02 ENCOUNTER — Other Ambulatory Visit: Payer: Self-pay | Admitting: Obstetrics and Gynecology

## 2016-09-02 ENCOUNTER — Other Ambulatory Visit (HOSPITAL_COMMUNITY)
Admission: RE | Admit: 2016-09-02 | Discharge: 2016-09-02 | Disposition: A | Discharge status: Home / Self Care | Payer: BLUE CROSS/BLUE SHIELD | Source: Ambulatory Visit | Attending: Obstetrics and Gynecology | Admitting: Obstetrics and Gynecology

## 2016-09-02 DIAGNOSIS — Z01419 Encounter for gynecological examination (general) (routine) without abnormal findings: Secondary | ICD-10-CM | POA: Insufficient documentation

## 2016-09-02 DIAGNOSIS — Z1151 Encounter for screening for human papillomavirus (HPV): Secondary | ICD-10-CM | POA: Diagnosis not present

## 2016-09-05 LAB — CYTOLOGY - PAP
Diagnosis: NEGATIVE
HPV: NOT DETECTED

## 2016-12-06 LAB — OB RESULTS CONSOLE RPR: RPR: NONREACTIVE

## 2016-12-06 LAB — OB RESULTS CONSOLE ABO/RH: RH Type: POSITIVE

## 2016-12-06 LAB — OB RESULTS CONSOLE GC/CHLAMYDIA
CHLAMYDIA, DNA PROBE: NEGATIVE
GC PROBE AMP, GENITAL: NEGATIVE

## 2016-12-06 LAB — OB RESULTS CONSOLE ANTIBODY SCREEN: Antibody Screen: NEGATIVE

## 2016-12-06 LAB — OB RESULTS CONSOLE HEPATITIS B SURFACE ANTIGEN: Hepatitis B Surface Ag: NEGATIVE

## 2016-12-06 LAB — OB RESULTS CONSOLE RUBELLA ANTIBODY, IGM: RUBELLA: IMMUNE

## 2016-12-06 LAB — OB RESULTS CONSOLE HIV ANTIBODY (ROUTINE TESTING): HIV: NONREACTIVE

## 2017-05-17 ENCOUNTER — Inpatient Hospital Stay (HOSPITAL_COMMUNITY)
Admission: AD | Admit: 2017-05-17 | Discharge: 2017-05-27 | DRG: 765 | Disposition: A | Discharge status: Home / Self Care | Payer: BLUE CROSS/BLUE SHIELD | Source: Ambulatory Visit | Attending: Obstetrics and Gynecology | Admitting: Obstetrics and Gynecology

## 2017-05-17 ENCOUNTER — Encounter (HOSPITAL_COMMUNITY): Payer: Self-pay

## 2017-05-17 ENCOUNTER — Inpatient Hospital Stay (HOSPITAL_COMMUNITY): Payer: BLUE CROSS/BLUE SHIELD

## 2017-05-17 DIAGNOSIS — O34219 Maternal care for unspecified type scar from previous cesarean delivery: Secondary | ICD-10-CM

## 2017-05-17 DIAGNOSIS — O321XX1 Maternal care for breech presentation, fetus 1: Secondary | ICD-10-CM | POA: Diagnosis present

## 2017-05-17 DIAGNOSIS — O42913 Preterm premature rupture of membranes, unspecified as to length of time between rupture and onset of labor, third trimester: Secondary | ICD-10-CM | POA: Diagnosis present

## 2017-05-17 DIAGNOSIS — Z880 Allergy status to penicillin: Secondary | ICD-10-CM | POA: Diagnosis not present

## 2017-05-17 DIAGNOSIS — O34211 Maternal care for low transverse scar from previous cesarean delivery: Secondary | ICD-10-CM | POA: Diagnosis present

## 2017-05-17 DIAGNOSIS — O4693 Antepartum hemorrhage, unspecified, third trimester: Secondary | ICD-10-CM

## 2017-05-17 DIAGNOSIS — O42919 Preterm premature rupture of membranes, unspecified as to length of time between rupture and onset of labor, unspecified trimester: Secondary | ICD-10-CM | POA: Diagnosis present

## 2017-05-17 DIAGNOSIS — Z91040 Latex allergy status: Secondary | ICD-10-CM | POA: Diagnosis not present

## 2017-05-17 DIAGNOSIS — Z3A33 33 weeks gestation of pregnancy: Secondary | ICD-10-CM

## 2017-05-17 DIAGNOSIS — O328XX2 Maternal care for other malpresentation of fetus, fetus 2: Secondary | ICD-10-CM | POA: Diagnosis present

## 2017-05-17 DIAGNOSIS — O30003 Twin pregnancy, unspecified number of placenta and unspecified number of amniotic sacs, third trimester: Secondary | ICD-10-CM

## 2017-05-17 DIAGNOSIS — Z98891 History of uterine scar from previous surgery: Secondary | ICD-10-CM

## 2017-05-17 DIAGNOSIS — O30043 Twin pregnancy, dichorionic/diamniotic, third trimester: Secondary | ICD-10-CM | POA: Diagnosis present

## 2017-05-17 DIAGNOSIS — O30049 Twin pregnancy, dichorionic/diamniotic, unspecified trimester: Secondary | ICD-10-CM | POA: Diagnosis present

## 2017-05-17 DIAGNOSIS — O4290 Premature rupture of membranes, unspecified as to length of time between rupture and onset of labor, unspecified weeks of gestation: Secondary | ICD-10-CM

## 2017-05-17 LAB — URIC ACID: Uric Acid, Serum: 5.1 mg/dL (ref 2.3–6.6)

## 2017-05-17 LAB — COMPREHENSIVE METABOLIC PANEL
ALT: 16 U/L (ref 14–54)
AST: 25 U/L (ref 15–41)
Albumin: 3 g/dL — ABNORMAL LOW (ref 3.5–5.0)
Alkaline Phosphatase: 107 U/L (ref 38–126)
Anion gap: 10 (ref 5–15)
BUN: 8 mg/dL (ref 6–20)
CO2: 20 mmol/L — ABNORMAL LOW (ref 22–32)
Calcium: 9.6 mg/dL (ref 8.9–10.3)
Chloride: 107 mmol/L (ref 101–111)
Creatinine, Ser: 0.44 mg/dL (ref 0.44–1.00)
GFR calc Af Amer: >60 mL/min (ref >60)
GFR calc non Af Amer: >60 mL/min (ref >60)
Glucose, Bld: 96 mg/dL (ref 65–99)
Potassium: 4 mmol/L (ref 3.5–5.1)
Sodium: 137 mmol/L (ref 135–145)
Total Bilirubin: 0.6 mg/dL (ref 0.3–1.2)
Total Protein: 6.3 g/dL — ABNORMAL LOW (ref 6.5–8.1)

## 2017-05-17 LAB — CBC WITH DIFFERENTIAL/PLATELET
BASOS ABS: 0 10*3/uL (ref 0.0–0.1)
Basophils Relative: 0 %
EOS PCT: 1 %
Eosinophils Absolute: 0.1 10*3/uL (ref 0.0–0.7)
HCT: 36.5 % (ref 36.0–46.0)
Hemoglobin: 12.6 g/dL (ref 12.0–15.0)
LYMPHS ABS: 2.1 10*3/uL (ref 0.7–4.0)
LYMPHS PCT: 18 %
MCH: 30.4 pg (ref 26.0–34.0)
MCHC: 34.5 g/dL (ref 30.0–36.0)
MCV: 88.2 fL (ref 78.0–100.0)
MONO ABS: 0.3 10*3/uL (ref 0.1–1.0)
MONOS PCT: 3 %
Neutro Abs: 9.5 10*3/uL — ABNORMAL HIGH (ref 1.7–7.7)
Neutrophils Relative %: 78 %
Platelets: 213 10*3/uL (ref 150–400)
RBC: 4.14 MIL/uL (ref 3.87–5.11)
RDW: 14.4 % (ref 11.5–15.5)
WBC: 12.1 10*3/uL — ABNORMAL HIGH (ref 4.0–10.5)

## 2017-05-17 LAB — URINALYSIS, ROUTINE W REFLEX MICROSCOPIC
Bilirubin Urine: NEGATIVE
Glucose, UA: NEGATIVE mg/dL
KETONES UR: NEGATIVE mg/dL
LEUKOCYTES UA: NEGATIVE
Nitrite: NEGATIVE
PH: 7 (ref 5.0–8.0)
Protein, ur: NEGATIVE mg/dL
Specific Gravity, Urine: 1.004 — ABNORMAL LOW (ref 1.005–1.030)

## 2017-05-17 LAB — WET PREP, GENITAL
Clue Cells Wet Prep HPF POC: NONE SEEN
SPERM: NONE SEEN
TRICH WET PREP: NONE SEEN
YEAST WET PREP: NONE SEEN

## 2017-05-17 LAB — PROTEIN / CREATININE RATIO, URINE
Creatinine, Urine: 56 mg/dL
Protein Creatinine Ratio: 0.18 mg/mg{Cre} — ABNORMAL HIGH (ref 0.00–0.15)
Total Protein, Urine: 10 mg/dL

## 2017-05-17 LAB — LACTATE DEHYDROGENASE: LDH: 158 U/L (ref 98–192)

## 2017-05-17 LAB — AMNISURE RUPTURE OF MEMBRANE (ROM) NOT AT ARMC: AMNISURE: POSITIVE

## 2017-05-17 MED ORDER — ZOLPIDEM TARTRATE 5 MG PO TABS: 5.0000 mg | ORAL_TABLET | Freq: Every evening | ORAL | Status: DC | PRN

## 2017-05-17 MED ORDER — BETAMETHASONE SOD PHOS & ACET 6 (3-3) MG/ML IJ SUSP
12.0000 mg | INTRAMUSCULAR | Status: AC
  Administered 2017-05-17 – 2017-05-18 (×2): 12 mg via INTRAMUSCULAR
  Filled 2017-05-17 (×2): qty 2

## 2017-05-17 MED ORDER — AZITHROMYCIN 250 MG PO TABS
1000.0000 mg | ORAL_TABLET | Freq: Once | ORAL | Status: AC
  Administered 2017-05-17: 1000 mg via ORAL
  Filled 2017-05-17: qty 4

## 2017-05-17 MED ORDER — LACTATED RINGERS IV SOLN
INTRAVENOUS | Status: DC
  Administered 2017-05-17 – 2017-05-19 (×3): via INTRAVENOUS

## 2017-05-17 MED ORDER — CALCIUM CARBONATE ANTACID 500 MG PO CHEW
2.0000 | CHEWABLE_TABLET | ORAL | Status: DC | PRN
  Administered 2017-05-21 – 2017-05-22 (×2): 400 mg via ORAL
  Filled 2017-05-17 (×2): qty 2

## 2017-05-17 MED ORDER — ACETAMINOPHEN 325 MG PO TABS
650.0000 mg | ORAL_TABLET | ORAL | Status: DC | PRN
  Administered 2017-05-17 – 2017-05-19 (×3): 650 mg via ORAL
  Filled 2017-05-17 (×3): qty 2

## 2017-05-17 MED ORDER — AZITHROMYCIN 500 MG IV SOLR
500.0000 mg | INTRAVENOUS | Status: DC
  Filled 2017-05-17: qty 500

## 2017-05-17 MED ORDER — CEFAZOLIN SODIUM-DEXTROSE 1-4 GM/50ML-% IV SOLN
1.0000 g | Freq: Three times a day (TID) | INTRAVENOUS | Status: AC
  Administered 2017-05-17 – 2017-05-19 (×6): 1 g via INTRAVENOUS
  Filled 2017-05-17 (×6): qty 50

## 2017-05-17 MED ORDER — CEPHALEXIN 500 MG PO CAPS: 500.0000 mg | ORAL_CAPSULE | Freq: Four times a day (QID) | ORAL | Status: DC

## 2017-05-17 MED ORDER — PRENATAL MULTIVITAMIN CH
1.0000 | ORAL_TABLET | Freq: Every day | ORAL | Status: DC
  Administered 2017-05-18 – 2017-05-23 (×6): 1 via ORAL
  Filled 2017-05-17 (×6): qty 1

## 2017-05-17 MED ORDER — DOCUSATE SODIUM 100 MG PO CAPS
100.0000 mg | ORAL_CAPSULE | Freq: Every day | ORAL | Status: DC
  Administered 2017-05-17 – 2017-05-23 (×7): 100 mg via ORAL
  Filled 2017-05-17 (×7): qty 1

## 2017-05-17 MED ORDER — BETAMETHASONE SOD PHOS & ACET 6 (3-3) MG/ML IJ SUSP
12.0000 mg | INTRAMUSCULAR | Status: DC
  Filled 2017-05-17: qty 2

## 2017-05-17 MED ORDER — AZITHROMYCIN 250 MG PO TABS: 500.0000 mg | ORAL_TABLET | Freq: Every day | ORAL | Status: DC

## 2017-05-17 MED ORDER — FAMOTIDINE 20 MG PO TABS
10.0000 mg | ORAL_TABLET | Freq: Every day | ORAL | Status: DC
  Administered 2017-05-17: 10 mg via ORAL
  Administered 2017-05-18: 20 mg via ORAL
  Administered 2017-05-19 – 2017-05-20 (×2): 10 mg via ORAL
  Filled 2017-05-17 (×5): qty 1

## 2017-05-17 NOTE — Progress Notes (Signed)
In and out cath for pr/cr ratio.

## 2017-05-17 NOTE — Progress Notes (Signed)
Spoke with Illene Bolus, CNM about patient seeing floaters and hyporeflexive DTRs.  Orders given.

## 2017-05-17 NOTE — MAU Note (Signed)
Pt is a G3P3 at 33.0 weeks with di/di TIUP, possible LOF at 0300, and brown spotting, decreased FM for baby A.  No other OB concerns.

## 2017-05-17 NOTE — Progress Notes (Signed)
Wanda Payne, CNM, called to ask about Amnisure.  Notified her that Amnisure test was positive.  CNM to put orders in for [place].  Notified patient's nurse Oletta Lamas of conversation.

## 2017-05-17 NOTE — H&P (Signed)
Wanda Payne is a 41 y.o.G3P2003 @ [redacted]w[redacted]d IVF di/di twin pregnancy  presenting for leaking fluid since 600am today OB History    Gravida Para Term Preterm AB Living   3 2 2     3    SAB TAB Ectopic Multiple Live Births         1 3     Past Medical History:  Diagnosis Date  . Asthma    exercise induced as a child  . Endometriosis   . Fibromyalgia   . History of kidney stones   . Infertility, female   . MVP (mitral valve prolapse)    as teenager   Past Surgical History:  Procedure Laterality Date  . CESAREAN SECTION    . CESAREAN SECTION N/A 04/07/2013   Procedure: CESAREAN SECTION/TWINS;  Surgeon: Dorien Chihuahua. Richardson Dopp, MD;  Location: [facility];  Service: Obstetrics;  Laterality: N/A;  . EXPLORATORY LAPAROTOMY    . kidney stent    . LAPAROSCOPY    . WISDOM TOOTH EXTRACTION     Family History: family history includes Hypertension in her father. Social History:  reports that she has never smoked. She does not have any smokeless tobacco history on file. She reports that she does not drink alcohol or use drugs.     Maternal Diabetes: Failed 1 hour 146 Genetic Screening: Declined Maternal Ultrasounds/Referrals: Normal Fetal Ultrasounds or other Referrals:  None Maternal Substance Abuse:  No Significant Maternal Medications:  None Significant Maternal Lab Results:  None Other Comments:  None  Review of Systems  Genitourinary:       C/o of small amount fluid coming from vagina and dark brown discharge  All other systems reviewed and are negative.  Maternal Medical History:  Reason for admission: Rupture of membranes.   Contractions: Frequency: rare.    Fetal activity: Perceived fetal activity is normal.   Last perceived fetal movement was within the past hour.    Prenatal complications: twins    Dilation: Closed Temperature 98.8 F (37.1 C), temperature source Oral, unknown if currently breastfeeding. Maternal Exam:  Uterine Assessment: Contraction frequency is rare.    Abdomen: Patient reports no abdominal tenderness. Surgical scars: low transverse.   Estimated fetal weight is 4 lbs each on ultrsound 2 weeks ago.    Introitus: Normal vulva. Normal vagina.  Amniotic fluid character: clear.  Cervix: Cervix evaluated by sterile speculum exam.     Fetal Exam Fetal Monitor Review: Mode: ultrasound.   Variability: moderate (6-25 bpm).   Pattern: accelerations present and no decelerations.    Fetal State Assessment: Category I - tracings are normal.     Physical Exam  Nursing note and vitals reviewed. Constitutional: She is oriented to person, place, and time. She appears well-developed and well-nourished. No distress.  HENT:  Head: Normocephalic and atraumatic.  Neck: Normal range of motion.  Cardiovascular: Normal rate and regular rhythm.   Respiratory: Effort normal and breath sounds normal. No respiratory distress.  GI: Soft. There is no tenderness.  Genitourinary: Vagina normal.  Genitourinary Comments: Spec exam reveals large amount clear amniotic fluid  Musculoskeletal: Normal range of motion.  Neurological: She is alert and oriented to person, place, and time.  Skin: Skin is warm and dry.  Psychiatric: She has a normal mood and affect. Her behavior is normal. Thought content normal.    Prenatal labs: ABO, Rh:  B POS Antibody:  Neg Rubella:  Immune RPR:   NR HBsAg:   Neg HIV:   Neg GBS:  Pending  Assessment/Plan: Admit to Antenatal Unit Latency ABX Betamethasone Course NICU Consult Plan Repeat C/S if labor ensues Dr Estanislado Pandy aware of pt status    Elmore Guise Adelei Scobey CNM 05/17/17 @ 309pm 05/17/2017, 2:55 PM

## 2017-05-18 ENCOUNTER — Encounter (HOSPITAL_COMMUNITY): Payer: Self-pay | Admitting: *Deleted

## 2017-05-18 DIAGNOSIS — O42919 Preterm premature rupture of membranes, unspecified as to length of time between rupture and onset of labor, unspecified trimester: Secondary | ICD-10-CM | POA: Diagnosis present

## 2017-05-18 LAB — RPR: RPR: NONREACTIVE

## 2017-05-18 MED ORDER — AZITHROMYCIN 1 G PO PACK
1.0000 g | PACK | Freq: Once | ORAL | Status: AC
  Administered 2017-05-18: 1 g via ORAL
  Filled 2017-05-18: qty 1

## 2017-05-18 NOTE — Progress Notes (Signed)
Hospital day # 1 di di twin pregnancy at [redacted]w[redacted]d with PPROM  S: well, reports good fetal activity      Contractions:none      Vaginal bleeding:none now and brown       Vaginal discharge: no significant change  O: BP 121/70 (BP Location: Left Arm)   Pulse (!) 103   Temp 97.9 F (36.6 C) (Oral)   Resp 16   Ht 5\' 9"  (1.753 m)   Wt 242 lb 8 oz (110 kg)   SpO2 99%   BMI 35.81 kg/m       Fetal tracings:reviewed and reassuring      Uterus non-tender      Extremities: extremities normal, atraumatic, no cyanosis or edema and no significant edema and no signs of DVT  A: [redacted]w[redacted]d with PPROM di di twins     stable  P: continue current plan of care      NICU consult today Elmore Guise Clemmons CNM 05/18/2017 11:39 AM

## 2017-05-18 NOTE — Consult Note (Signed)
Neonatology Consultation: Requested by: L.  Clemmons Reason: pre-term labor  Mrs. Wanda Payne is now at 37 weeks with di/di twins after IVF.  She was admitted with contractions, but has been treated with IV fluids and antibiotics and she reports no sensation of contractions at present.  We discussed the usual problems of premature babies at 33 weeks, mainly confined to feeding problems and risk for infection but with some risk for respiratory insufficiency.  We also discussed the higher risk for cognitive delays in premature babies and the desirability of prolonging the pregnancy safely.  I told her to contact me or my colleagues if she has any further questions.  Seven Dollens L. Cleatis Polka M.D. Neonatology

## 2017-05-19 ENCOUNTER — Inpatient Hospital Stay (HOSPITAL_COMMUNITY): Payer: BLUE CROSS/BLUE SHIELD

## 2017-05-19 ENCOUNTER — Encounter (HOSPITAL_COMMUNITY): Payer: Self-pay | Admitting: *Deleted

## 2017-05-19 LAB — CULTURE, BETA STREP (GROUP B ONLY)

## 2017-05-19 LAB — GC/CHLAMYDIA PROBE AMP (~~LOC~~) NOT AT ARMC
CHLAMYDIA, DNA PROBE: NEGATIVE
Neisseria Gonorrhea: NEGATIVE

## 2017-05-19 MED ORDER — CEPHALEXIN 500 MG PO CAPS
500.0000 mg | ORAL_CAPSULE | Freq: Four times a day (QID) | ORAL | Status: DC
  Administered 2017-05-20 – 2017-05-23 (×16): 500 mg via ORAL
  Filled 2017-05-19 (×17): qty 1

## 2017-05-19 NOTE — Lactation Note (Addendum)
Lactation Consultation Note  Patient Name: Wanda Payne'M Date: 05/19/2017   Initial consult with mom who is to deliver 34 week twins this weekend at [facility] request per Meta Hatchet, DON.  Mom reports she BF her 41 yo for 18 months. She Attempted to BF her term 39 yo twins for less than 2 weeks. She reports she had difficulty with latching, babies that liked to eat all the time and had difficulty with tandem nursing the infants and then mom got yeast and ended up stopping BF.   She was wanting information on providing breast milk for her preterm twins. Mom has a Medela PIS at home. Providing Milk for Your Baby in NICU booklet given. Reviewed pumping and what to expect with pumping, hand expression, colostrum, milk coming to volume and breast milk handling and storage for the preterm infant. Mom reports she would like to BF but is not sure how long she can BF. Enc mom to take one day at a time.   Mom was informed a DEBP pump will be set up at her bedside after delivery and enc mom to begin pumping by 6 hours after delivery. Discussed hand expression and importance in promoting milk production.   Reviewed preterm infants and need for added calories and milk fortifiers and BF behaviors of the preterm infant. Mom reports she has talked with neonatologist about feeding the infants also.  Discussed that Lactation would be glad to work with mom after babies are born on inpatient and outpatient basis. Mom was given [facility] Brochure with [facility] phone # to call with any further questions/concerns. Mom without further questions/concerns at this time.      Maternal Data    Feeding    LATCH Score                   Interventions    Lactation Tools Discussed/Used     Consult Status      [name] 05/19/2017, 12:01 PM

## 2017-05-19 NOTE — Progress Notes (Signed)
HD #3 33 wks and 2 days with Di/Di twins PPROM  Subjective. Pt reports occasional contraction nothing regular. Continued leakage of fluid. + FM no vaginal bleeding. NO abdominal pain   Vitals:   05/19/17 1205 05/19/17 1555  BP: 124/72 (!) 104/44  Pulse: 100 (!) 106  Resp: 14 16  Temp: 97.8 F (36.6 C) 97.9 F (36.6 C)  SpO2: 98% 97%   General Alert and Oriented no acute distress  Lungs Normal effort no distress  Abdomen Gravid nontender  Ext SCD's in place no evidence of DVT  NST this AM Pending.   A/P 33 wks and 2 days with Di/Di twins PPROM  Continue latency Antibiotics  NO sign of chorioamnionitis  H/o Cesarean section . Plan repeat cesarean section 05/24/2017 at 34 wks 0 days . This is scheduled for 730 am . Sooner if pt develops signs of chorioamnionitis/ labor/ or fetal distress  NST q shift.Lawrence Marseilles with bathroom priveleges Steroids Complete

## 2017-05-19 NOTE — Progress Notes (Signed)
Maternal Fetal Medicine Consultation  Requesting Provider(s):Cole  Primary JW:JXBJ Reason for consultation: dichorionic diamniotic twins at 33+2 weeks with PPROM  HPI: 41 yo P2001 at 33+2 weeks and diamniotic dichorionic twins via IVF admitted 8/25 with leaking fluid and brownish discharge. Exam by CNM showed gross ROM on speculum exam and Amnisure test was positive. Up to this point the pregnancy has been unremarkable. She has received latency antibiotics and a full course of betamethasone. She has been seen by Neonatology. She currently reports no vaginal bleeding. She underwent US on admission that showed placenta for twin B to have an echogenic central locus and a cystic area at the placental surface OB History: OB History    Gravida Para Term Preterm AB Living   3 2 2     3    SAB TAB Ectopic Multiple Live Births         1 3    both pregnancies were IVF, the second with di/di twins; both delivered at term by C/S  PMH:  Past Medical History:  Diagnosis Date  . Asthma    exercise induced as a child  . Endometriosis   . Fibromyalgia   . History of kidney stones   . Infertility, female   . MVP (mitral valve prolapse)    as teenager    PSH:  Past Surgical History:  Procedure Laterality Date  . CESAREAN SECTION    . CESAREAN SECTION N/A 04/07/2013   Procedure: CESAREAN SECTION/TWINS;  Surgeon: Dorien Chihuahua. Richardson Dopp, MD;  Location: [facility];  Service: Obstetrics;  Laterality: N/A;  . EXPLORATORY LAPAROTOMY    . kidney stent    . LAPAROSCOPY    . WISDOM TOOTH EXTRACTION     Meds: See EPIC section Allergies: penicillin FH:See EPIC section Soc:See EPIC section  Review of Systems: no vaginal bleeding or cramping/contractions, no LOF, no nausea/vomiting. All other systems reviewed and are negative.  PE:See EPIC section  A/P: Placental abnormality Twin B I agree with Dr. Marjo Bicker that this my be a chorioangioma or an area of old bleeding; neither has significantly impacted the pregnancy  so far and is very unlikely to do so prior to delivery. I would recommend that both placentas be sent to pathology for evaluation, with the [place] finding noted for the pathologist Diamniotic dichorionic twins with PPROM All necessary interventions and precautions have be taken. We recommend continued inpatient management, completion of latency antibiotics, and delivery at 34+0 weeks  Thank you for the opportunity to be a part of the care of Wanda Payne. Please contact our office if we can be of further assistance.   I spent approximately 30 minutes with this patient with over 50% of time spent in face-to-face counseling.

## 2017-05-19 NOTE — Progress Notes (Signed)
MFM made aware of order for consult per Dr. Richardson Dopp.

## 2017-05-20 LAB — TYPE AND SCREEN
ABO/RH(D): B POS
Antibody Screen: NEGATIVE

## 2017-05-20 NOTE — Progress Notes (Addendum)
HD #4 33 wks and 3 days with Di/Di twins PPROM  Subjective. Pt reports occasional contraction nothing regular. Continued leakage of fluid. + FM no vaginal bleeding. NO abdominal pain   Vitals:   05/20/17 1200 05/20/17 1529  BP: 132/80 130/70  Pulse: 67 95  Resp: 18 18  Temp: 98.3 F (36.8 C) 97.9 F (36.6 C)  SpO2: 100% 98%     General Alert and Oriented no acute distress  Lungs Normal effort no distress  Abdomen Gravid nontender  Ext SCD's in place no evidence of DVT  NST this AM Pending.   A/P 33 wks and 3 days with Di/Di twins PPROM  Continue latency Antibiotics  NO sign of chorioamnionitis  H/o Cesarean section . Plan repeat cesarean section 05/24/2017 at 34 wks 0 days . This is scheduled for 730 am . Sooner if pt develops signs of chorioamnionitis/ labor/ or fetal distress  NST q shift.Wanda Payne with bathroom priveleges Steroids Complete

## 2017-05-20 NOTE — Progress Notes (Signed)
Patient reported cramping after eating but stated she felt like baby A was moving a lot and 'being a jerk'. Toco adjusted.

## 2017-05-21 MED ORDER — SODIUM CHLORIDE 0.9% FLUSH
3.0000 mL | Freq: Two times a day (BID) | INTRAVENOUS | Status: DC
  Administered 2017-05-21 – 2017-05-23 (×6): 3 mL via INTRAVENOUS

## 2017-05-21 MED ORDER — FAMOTIDINE 20 MG PO TABS
20.0000 mg | ORAL_TABLET | Freq: Every day | ORAL | Status: DC
Start: 2017-05-21 — End: 2017-05-27
  Administered 2017-05-21 – 2017-05-27 (×6): 20 mg via ORAL
  Filled 2017-05-21 (×5): qty 1

## 2017-05-21 NOTE — Progress Notes (Signed)
HD #5 33 wks and 4 days Di/Di twins with PPROM  Subjective: pt denies contractions no current leakage of fluid feels it when having a bowel movement.Marland Kitchen +FM no vaginal bleeding. No abdominal pain   Vitals:   05/21/17 1200 05/21/17 1636  BP: 126/70 (!) 82/67  Pulse: 94 (!) 117  Resp: 16 20  Temp: 97.7 F (36.5 C) 98.4 F (36.9 C)  SpO2: 95% 98%    General Alert and Oriented no acute distress  Lungs Normal effort no distress  Abdomen Gravid nontender  Ext SCD's in place no evidence of DVT  NST this AM  Reactive x 2   A/P 33 wks and 4 days with Di/Di twins PPROM  Continue latency Antibiotics  NO sign of chorioamnionitis  H/o Cesarean section . Plan repeat cesarean section 05/24/2017 at 34 wks 0 days . This is scheduled for 730 am . Sooner if pt develops signs of chorioamnionitis/ labor/ or fetal distress  NST q shift.Lawrence Marseilles with bathroom priveleges Steroids Complete

## 2017-05-22 NOTE — Progress Notes (Signed)
HD #6 33 wks and 5 days Di/Di twins with PPROM  Subjective: Pt resting comfortably in bed.  Denies further leaking of fluid, does note occasional pink discharge when she voids. +FM x 2 no vaginal bleeding. No abdominal pain.  No acute complaints  Vitals:   05/21/17 1952 05/22/17 0000  BP: 122/76 128/76  Pulse: 96 93  Resp: 18   Temp:  98 F (36.7 C)  SpO2: 98% 97%    General Alert and Oriented no acute distress  CV: RRR Lungs CTAB Abdomen Gravid nontender  Ext SCD's in place no evidence of DVT  NST this AM  Reactive x 2  Toco: some irritability, no regular contractions  A/P 33 wks and 5 days with Di/Di twins PPROM  Continue latency Antibiotics  No sign of chorioamnionitis  H/o Cesarean section . Plan repeat cesarean section 05/24/2017 at 34 wks 0 days . This is scheduled for 730 am . Sooner if pt develops signs of chorioamnionitis/ labor/ or fetal distress  NST q shift.Lawrence Marseilles.  Bedrest with bathroom priveleges Steroids Complete   Myna HidalgoJennifer Alanta Scobey, DO [phone] (pager) [phone] (office)

## 2017-05-23 ENCOUNTER — Encounter (HOSPITAL_COMMUNITY): Payer: Self-pay

## 2017-05-23 ENCOUNTER — Encounter (HOSPITAL_COMMUNITY): Admit: 2017-05-23 | Payer: BLUE CROSS/BLUE SHIELD

## 2017-05-23 LAB — CBC
HEMATOCRIT: 38.1 % (ref 36.0–46.0)
HEMOGLOBIN: 13 g/dL (ref 12.0–15.0)
MCH: 30.4 pg (ref 26.0–34.0)
MCHC: 34.1 g/dL (ref 30.0–36.0)
MCV: 89 fL (ref 78.0–100.0)
Platelets: 213 10*3/uL (ref 150–400)
RBC: 4.28 MIL/uL (ref 3.87–5.11)
RDW: 14.9 % (ref 11.5–15.5)
WBC: 16.4 10*3/uL — AB (ref 4.0–10.5)

## 2017-05-23 LAB — TYPE AND SCREEN
ABO/RH(D): B POS
ABO/RH(D): B POS
ANTIBODY SCREEN: NEGATIVE
Antibody Screen: NEGATIVE

## 2017-05-23 MED ORDER — SOD CITRATE-CITRIC ACID 500-334 MG/5ML PO SOLN: 30.0000 mL | ORAL | Status: DC

## 2017-05-23 MED ORDER — CEFAZOLIN SODIUM-DEXTROSE 2-4 GM/100ML-% IV SOLN
2.0000 g | INTRAVENOUS | Status: AC
  Administered 2017-05-24: 2 g via INTRAVENOUS
  Filled 2017-05-23: qty 100

## 2017-05-23 NOTE — Progress Notes (Addendum)
HD #7 33 wks and 6 days Di/Di twins with PPROM  Subjective:no complaints. Tired. No contractions. No vaginal bleeding +FM.Marland Kitchen. Leakage of fluid with bowel movements.   Vitals:   05/23/17 0618 05/23/17 0700  BP: 103/70   Pulse: 98   Resp:    Temp: (!) 97.5 F (36.4 C) 98 F (36.7 C)  SpO2: 97%     General Alert and Oriented no acute distress  CV: RRR Lungs CTAB Abdomen Gravid nontender  Ext SCD's in place no evidence of DVT  NST this AM pending Toco: some irritability, no regular contractions  A/P 33 wks and 6 days with Di/Di twins PPROM  Continue latency Antibiotics  No sign of chorioamnionitis  H/o Cesarean section . Plan repeat cesarean section 05/24/2017 at 34 wks 0 days . This is scheduled for 730 am . Sooner if pt develops signs of chorioamnionitis/ labor/ or fetal distress  NST q shift.Lawrence Marseilles.  Bedrest with bathroom priveleges Steroids Complete  R/B/A of cesarean section discussed with the patient including but not limited to infection , bleeding damage to bowel bladder and surrounding organs with the need for further surgery, r/o hysterectomy and transfusion discussed. Pt voiced understanding and desires to proceed.

## 2017-05-23 NOTE — Progress Notes (Signed)
Initial visit with Wanda Payne to introduce spiritual care services and offer support after a week of hospitalization prior to her c-section tomorrow morning.  Wanda Payne shared that her previous two pregnancies were typical and that her set of 39 year old twins (Wanda Payne and Wanda Payne) were born at 6338 weeks, so this has been really frightening.  She's particularly anxious about her third c-section and shared that she's given her doctor instructions to do whatever is necessary medically to assure that she'll go home to raise her 5 children.  This week away from her other three children has meant missing out on preschool orientation and the first day at a new school (for Wanda Payne) and that others are having to take care of things that are normally her responsibility.  She shared that writing out instructions for preschool and feeding the dog (Wanda Payne, her companion of 14 years and a major support during difficult times) has felt like writing her last will and testament.  Wanda Payne discussed her resources for support and coping in the midst of feeling so overwhelmed and frightened.  At this point, she's very eager for Wanda Payne and Wanda HaJonathan (who will be called [name]) to be born.  She is grateful for her mother in-law who is in town from [place] to help care for the family and who has given birth to two premies (Wanda Payne and his sister Wanda Payne who died at 6 hours of life).  Will continue to follow.  Please page as further needs arise.  Wanda ShapeAmanda M. Carley Hammedavee Lomax, M.Div. [facility] Chaplain Pager [phone] Office [phone]

## 2017-05-24 ENCOUNTER — Encounter (HOSPITAL_COMMUNITY)
Admission: AD | Disposition: A | Discharge status: Home / Self Care | Payer: Self-pay | Source: Ambulatory Visit | Attending: Obstetrics and Gynecology

## 2017-05-24 ENCOUNTER — Inpatient Hospital Stay (HOSPITAL_COMMUNITY): Payer: BLUE CROSS/BLUE SHIELD | Admitting: Anesthesiology

## 2017-05-24 ENCOUNTER — Encounter (HOSPITAL_COMMUNITY): Payer: Self-pay | Admitting: General Practice

## 2017-05-24 SURGERY — Surgical Case
Anesthesia: Spinal

## 2017-05-24 MED ORDER — MEPERIDINE HCL 25 MG/ML IJ SOLN
INTRAMUSCULAR | Status: DC | PRN
  Administered 2017-05-24 (×2): 12.5 mg via INTRAVENOUS

## 2017-05-24 MED ORDER — PHENYLEPHRINE 8 MG IN D5W 100 ML (0.08MG/ML) PREMIX OPTIME
INJECTION | INTRAVENOUS | Status: DC | PRN
  Administered 2017-05-24: 60 ug/min via INTRAVENOUS

## 2017-05-24 MED ORDER — DEXAMETHASONE SODIUM PHOSPHATE 4 MG/ML IJ SOLN
INTRAMUSCULAR | Status: DC | PRN
  Administered 2017-05-24: 4 mg via INTRAVENOUS

## 2017-05-24 MED ORDER — METHYLERGONOVINE MALEATE 0.2 MG/ML IJ SOLN
INTRAMUSCULAR | Status: AC
  Filled 2017-05-24: qty 1

## 2017-05-24 MED ORDER — MEPERIDINE HCL 25 MG/ML IJ SOLN
INTRAMUSCULAR | Status: AC
  Filled 2017-05-24: qty 1

## 2017-05-24 MED ORDER — PHENYLEPHRINE 8 MG IN D5W 100 ML (0.08MG/ML) PREMIX OPTIME
INJECTION | INTRAVENOUS | Status: AC
  Filled 2017-05-24: qty 100

## 2017-05-24 MED ORDER — METOCLOPRAMIDE HCL 5 MG/ML IJ SOLN
INTRAMUSCULAR | Status: DC | PRN
  Administered 2017-05-24: 10 mg via INTRAVENOUS

## 2017-05-24 MED ORDER — OXYCODONE HCL 5 MG PO TABS
10.0000 mg | ORAL_TABLET | ORAL | Status: DC | PRN
  Administered 2017-05-25 – 2017-05-26 (×5): 10 mg via ORAL
  Filled 2017-05-24 (×5): qty 2

## 2017-05-24 MED ORDER — FERROUS SULFATE 325 (65 FE) MG PO TABS
325.0000 mg | ORAL_TABLET | Freq: Two times a day (BID) | ORAL | Status: DC
  Administered 2017-05-24 – 2017-05-27 (×6): 325 mg via ORAL
  Filled 2017-05-24 (×9): qty 1

## 2017-05-24 MED ORDER — SENNOSIDES-DOCUSATE SODIUM 8.6-50 MG PO TABS
2.0000 | ORAL_TABLET | ORAL | Status: DC
  Administered 2017-05-24 – 2017-05-27 (×3): 2 via ORAL
  Filled 2017-05-24 (×5): qty 2

## 2017-05-24 MED ORDER — OXYTOCIN 10 UNIT/ML IJ SOLN
INTRAMUSCULAR | Status: DC | PRN
  Administered 2017-05-24: 40 [IU] via INTRAVENOUS

## 2017-05-24 MED ORDER — BUPIVACAINE IN DEXTROSE 0.75-8.25 % IT SOLN
INTRATHECAL | Status: AC
  Filled 2017-05-24: qty 2

## 2017-05-24 MED ORDER — MORPHINE SULFATE (PF) 0.5 MG/ML IJ SOLN
INTRAMUSCULAR | Status: DC | PRN
  Administered 2017-05-24: .2 mg via INTRATHECAL

## 2017-05-24 MED ORDER — SIMETHICONE 80 MG PO CHEW
80.0000 mg | CHEWABLE_TABLET | Freq: Three times a day (TID) | ORAL | Status: DC
  Administered 2017-05-24 – 2017-05-27 (×8): 80 mg via ORAL
  Filled 2017-05-24 (×14): qty 1

## 2017-05-24 MED ORDER — DIPHENHYDRAMINE HCL 25 MG PO CAPS
25.0000 mg | ORAL_CAPSULE | Freq: Four times a day (QID) | ORAL | Status: DC | PRN
  Filled 2017-05-24: qty 1

## 2017-05-24 MED ORDER — LACTATED RINGERS IV SOLN
INTRAVENOUS | Status: DC | PRN
  Administered 2017-05-24: 08:00:00 via INTRAVENOUS

## 2017-05-24 MED ORDER — CARBOPROST TROMETHAMINE 250 MCG/ML IM SOLN
INTRAMUSCULAR | Status: AC
Start: 2017-05-24 — End: 2017-05-24
  Filled 2017-05-24: qty 1

## 2017-05-24 MED ORDER — MENTHOL 3 MG MT LOZG
1.0000 | LOZENGE | OROMUCOSAL | Status: DC | PRN
  Filled 2017-05-24: qty 9

## 2017-05-24 MED ORDER — ONDANSETRON HCL 4 MG/2ML IJ SOLN
INTRAMUSCULAR | Status: DC | PRN
  Administered 2017-05-24: 4 mg via INTRAVENOUS

## 2017-05-24 MED ORDER — DEXAMETHASONE SODIUM PHOSPHATE 4 MG/ML IJ SOLN
INTRAMUSCULAR | Status: AC
  Filled 2017-05-24: qty 1

## 2017-05-24 MED ORDER — IBUPROFEN 600 MG PO TABS
600.0000 mg | ORAL_TABLET | Freq: Four times a day (QID) | ORAL | Status: DC
  Administered 2017-05-24 – 2017-05-27 (×11): 600 mg via ORAL
  Filled 2017-05-24 (×12): qty 1

## 2017-05-24 MED ORDER — FENTANYL CITRATE (PF) 100 MCG/2ML IJ SOLN
INTRAMUSCULAR | Status: AC
  Filled 2017-05-24: qty 2

## 2017-05-24 MED ORDER — METHYLERGONOVINE MALEATE 0.2 MG PO TABS
0.2000 mg | ORAL_TABLET | ORAL | Status: DC
  Administered 2017-05-24 – 2017-05-25 (×7): 0.2 mg via ORAL
  Filled 2017-05-24 (×7): qty 1

## 2017-05-24 MED ORDER — WITCH HAZEL-GLYCERIN EX PADS: 1.0000 "application " | MEDICATED_PAD | CUTANEOUS | Status: DC | PRN

## 2017-05-24 MED ORDER — FENTANYL CITRATE (PF) 100 MCG/2ML IJ SOLN
INTRAMUSCULAR | Status: DC | PRN
  Administered 2017-05-24: 25 ug via INTRATHECAL

## 2017-05-24 MED ORDER — MISOPROSTOL 200 MCG PO TABS
ORAL_TABLET | ORAL | Status: AC
Start: 2017-05-24 — End: 2017-05-24
  Filled 2017-05-24: qty 5

## 2017-05-24 MED ORDER — SIMETHICONE 80 MG PO CHEW
80.0000 mg | CHEWABLE_TABLET | ORAL | Status: DC
  Administered 2017-05-24 – 2017-05-27 (×3): 80 mg via ORAL
  Filled 2017-05-24 (×5): qty 1

## 2017-05-24 MED ORDER — OXYTOCIN 40 UNITS IN LACTATED RINGERS INFUSION - SIMPLE MED: 2.5000 [IU]/h | INTRAVENOUS | Status: AC

## 2017-05-24 MED ORDER — SIMETHICONE 80 MG PO CHEW
80.0000 mg | CHEWABLE_TABLET | ORAL | Status: DC | PRN
Start: 2017-05-24 — End: 2017-05-27
  Filled 2017-05-24: qty 1

## 2017-05-24 MED ORDER — LACTATED RINGERS IV SOLN
INTRAVENOUS | Status: DC | PRN
  Administered 2017-05-24 (×2): via INTRAVENOUS

## 2017-05-24 MED ORDER — ONDANSETRON HCL 4 MG/2ML IJ SOLN: 4.0000 mg | Freq: Once | INTRAMUSCULAR | Status: DC | PRN

## 2017-05-24 MED ORDER — FENTANYL CITRATE (PF) 100 MCG/2ML IJ SOLN: 25.0000 ug | INTRAMUSCULAR | Status: DC | PRN

## 2017-05-24 MED ORDER — METHYLERGONOVINE MALEATE 0.2 MG/ML IJ SOLN: 0.2000 mg | INTRAMUSCULAR | Status: DC

## 2017-05-24 MED ORDER — PRENATAL MULTIVITAMIN CH
1.0000 | ORAL_TABLET | Freq: Every day | ORAL | Status: DC
  Administered 2017-05-25 – 2017-05-26 (×2): 1 via ORAL
  Filled 2017-05-24 (×4): qty 1

## 2017-05-24 MED ORDER — ZOLPIDEM TARTRATE 5 MG PO TABS: 5.0000 mg | ORAL_TABLET | Freq: Every evening | ORAL | Status: DC | PRN

## 2017-05-24 MED ORDER — ONDANSETRON HCL 4 MG/2ML IJ SOLN
INTRAMUSCULAR | Status: AC
  Filled 2017-05-24: qty 2

## 2017-05-24 MED ORDER — MISOPROSTOL 200 MCG PO TABS
ORAL_TABLET | ORAL | Status: AC
  Administered 2017-05-24: 1000 ug via RECTAL
  Filled 2017-05-24: qty 5

## 2017-05-24 MED ORDER — SODIUM CHLORIDE 0.9 % IR SOLN
Status: DC | PRN
  Administered 2017-05-24: 1

## 2017-05-24 MED ORDER — SCOPOLAMINE 1 MG/3DAYS TD PT72
MEDICATED_PATCH | TRANSDERMAL | Status: AC
  Filled 2017-05-24: qty 1

## 2017-05-24 MED ORDER — OXYCODONE HCL 5 MG PO TABS
5.0000 mg | ORAL_TABLET | ORAL | Status: DC | PRN
  Administered 2017-05-25: 5 mg via ORAL
  Filled 2017-05-24: qty 1

## 2017-05-24 MED ORDER — ACETAMINOPHEN 325 MG PO TABS: 650.0000 mg | ORAL_TABLET | ORAL | Status: DC | PRN

## 2017-05-24 MED ORDER — LACTATED RINGERS IV SOLN
INTRAVENOUS | Status: DC
  Administered 2017-05-24: 19:00:00 via INTRAVENOUS

## 2017-05-24 MED ORDER — MAGNESIUM HYDROXIDE 400 MG/5ML PO SUSP
30.0000 mL | ORAL | Status: DC | PRN
  Filled 2017-05-24: qty 30

## 2017-05-24 MED ORDER — OXYTOCIN 10 UNIT/ML IJ SOLN
INTRAMUSCULAR | Status: AC
  Filled 2017-05-24: qty 4

## 2017-05-24 MED ORDER — BUPIVACAINE IN DEXTROSE 0.75-8.25 % IT SOLN
INTRATHECAL | Status: DC | PRN
  Administered 2017-05-24: 13 mg via INTRATHECAL

## 2017-05-24 MED ORDER — CEFAZOLIN SODIUM-DEXTROSE 2-4 GM/100ML-% IV SOLN
INTRAVENOUS | Status: AC
  Filled 2017-05-24: qty 100

## 2017-05-24 MED ORDER — MORPHINE SULFATE (PF) 0.5 MG/ML IJ SOLN
INTRAMUSCULAR | Status: AC
  Filled 2017-05-24: qty 10

## 2017-05-24 MED ORDER — METHYLERGONOVINE MALEATE 0.2 MG/ML IJ SOLN: 0.2000 mg | INTRAMUSCULAR | Status: DC | PRN

## 2017-05-24 MED ORDER — MEPERIDINE HCL 25 MG/ML IJ SOLN: 6.2500 mg | INTRAMUSCULAR | Status: DC | PRN

## 2017-05-24 MED ORDER — METHYLERGONOVINE MALEATE 0.2 MG PO TABS
0.2000 mg | ORAL_TABLET | ORAL | Status: DC | PRN
  Administered 2017-05-24: 0.2 mg via ORAL
  Filled 2017-05-24: qty 1

## 2017-05-24 MED ORDER — DIBUCAINE 1 % RE OINT: 1.0000 "application " | TOPICAL_OINTMENT | RECTAL | Status: DC | PRN

## 2017-05-24 MED ORDER — METOCLOPRAMIDE HCL 5 MG/ML IJ SOLN
INTRAMUSCULAR | Status: AC
  Filled 2017-05-24: qty 2

## 2017-05-24 MED ORDER — COCONUT OIL OIL
1.0000 "application " | TOPICAL_OIL | Status: DC | PRN
  Administered 2017-05-25: 1 via TOPICAL
  Filled 2017-05-24 (×2): qty 120

## 2017-05-24 MED ORDER — SOD CITRATE-CITRIC ACID 500-334 MG/5ML PO SOLN
ORAL | Status: AC
  Administered 2017-05-24: 30 mL
  Filled 2017-05-24: qty 15

## 2017-05-24 MED ORDER — METHYLERGONOVINE MALEATE 0.2 MG/ML IJ SOLN: INTRAMUSCULAR | Status: DC | PRN

## 2017-05-24 MED ORDER — KETOROLAC TROMETHAMINE 30 MG/ML IJ SOLN
INTRAMUSCULAR | Status: AC
  Administered 2017-05-24: 30 mg via INTRAMUSCULAR
  Filled 2017-05-24: qty 1

## 2017-05-24 SURGICAL SUPPLY — 30 items
BARRIER ADHS 3X4 INTERCEED (GAUZE/BANDAGES/DRESSINGS) ×2 IMPLANT
BRR ADH 4X3 ABS CNTRL BYND (GAUZE/BANDAGES/DRESSINGS) ×1
CHLORAPREP W/TINT 26ML (MISCELLANEOUS) ×4 IMPLANT
CLAMP CORD UMBIL (MISCELLANEOUS) ×4 IMPLANT
CLOTH BEACON ORANGE TIMEOUT ST (SAFETY) ×2 IMPLANT
DRESSING DISP NPWT PICO 4X12 (MISCELLANEOUS) ×4 IMPLANT
ELECT REM PT RETURN 9FT ADLT (ELECTROSURGICAL) ×3
ELECTRODE REM PT RTRN 9FT ADLT (ELECTROSURGICAL) IMPLANT
GLOVE BIOGEL M 6.5 STRL (GLOVE) ×4 IMPLANT
GLOVE BIOGEL PI IND STRL 6.5 (GLOVE) IMPLANT
GLOVE BIOGEL PI IND STRL 7.0 (GLOVE) IMPLANT
GLOVE BIOGEL PI INDICATOR 6.5 (GLOVE) ×4
GLOVE BIOGEL PI INDICATOR 7.0 (GLOVE) ×4
GOWN STRL REUS W/TWL LRG LVL3 (GOWN DISPOSABLE) ×6 IMPLANT
HEMOSTAT ARISTA ABSORB 3G PWDR (MISCELLANEOUS) ×2 IMPLANT
NS IRRIG 1000ML POUR BTL (IV SOLUTION) ×2 IMPLANT
PACK C SECTION WH (CUSTOM PROCEDURE TRAY) ×2 IMPLANT
PAD OB MATERNITY 4.3X12.25 (PERSONAL CARE ITEMS) ×2 IMPLANT
PENCIL SMOKE EVAC W/HOLSTER (ELECTROSURGICAL) ×2 IMPLANT
RETRACTOR TRAXI PANNICULUS (MISCELLANEOUS) IMPLANT
SUT PDS AB 0 CT1 27 (SUTURE) ×4 IMPLANT
SUT VIC AB 0 CTX 36 (SUTURE) ×9
SUT VIC AB 0 CTX36XBRD ANBCTRL (SUTURE) IMPLANT
SUT VIC AB 2-0 CT1 (SUTURE) ×2 IMPLANT
SUT VIC AB 2-0 CT1 27 (SUTURE) ×3
SUT VIC AB 2-0 CT1 TAPERPNT 27 (SUTURE) IMPLANT
SUT VIC AB 4-0 KS 27 (SUTURE) ×2 IMPLANT
TOWEL OR 17X24 6PK STRL BLUE (TOWEL DISPOSABLE) ×2 IMPLANT
TRAXI PANNICULUS RETRACTOR (MISCELLANEOUS) ×2
TRAY FOLEY BAG SILVER LF 14FR (SET/KITS/TRAYS/PACK) ×2 IMPLANT

## 2017-05-24 NOTE — Progress Notes (Addendum)
Patient transferred to OR @0721  hr. Written report given  to OR nurse

## 2017-05-24 NOTE — Progress Notes (Signed)
Pt went to NICU via [facility]. She was accompanied by her husband.

## 2017-05-24 NOTE — Anesthesia Rounding Note (Deleted)
  [facility] Epidural Rounding Note  Patient: Wanda Payne, 41 y.o., female  Patient's current pain level: Pain Score: 0-No pain (05/24/17 0626)  Agreed upon pain management level: 3  Epidural intervention: No   Comments:   Karthikeya Funke 05/24/2017

## 2017-05-24 NOTE — Progress Notes (Signed)
Subjective: Postpartum Day 0: Cesarean Delivery Patient reports incisional pain.  Called to see patient secondary to uterine atony.. Per nurse patient has received methergine 0.2mg  po at 1315 and continued to be boggy. She was advised to place cytotec 1000 mcg per rectum...   Objective: Vital signs in last 24 hours: Temp:  [97.7 F (36.5 C)-99 F (37.2 C)] 98.1 F (36.7 C) (09/01 1500) Pulse Rate:  [73-118] 73 (09/01 1500) Resp:  [15-23] 18 (09/01 1500) BP: (100-131)/(56-80) 114/64 (09/01 1500) SpO2:  [94 %-99 %] 98 % (09/01 1500)  Physical Exam:  General: alert, cooperative and no distress Lochia: up arrival minnimal lochia and uterus ferm.. Pad on for past 15 minutes with minimal spotting  Uterine Fundus: firm Incision: slightly blood stained  DVT Evaluation: No evidence of DVT seen on physical exam.   Recent Labs  05/23/17 0803  HGB 13.0  HCT 38.1    Assessment/Plan: Status post Cesarean section. Doing well postoperatively. Uterine atony... resolved .. continue methergine every 4 hours po for total of 6 doses.   Continue current care.  Treyvin Glidden J. 05/24/2017, 3:24 PM

## 2017-05-24 NOTE — Anesthesia Preprocedure Evaluation (Signed)
Anesthesia Evaluation  Patient identified by MRN, date of birth, ID band Patient awake    Reviewed: Allergy & Precautions, H&P , NPO status , Patient's Chart, lab work & pertinent test results  Airway Mallampati: II  TM Distance: >3 FB Neck ROM: full    Dental no notable dental hx.    Pulmonary    Pulmonary exam normal        Cardiovascular negative cardio ROS Normal cardiovascular exam     Neuro/Psych negative psych ROS   GI/Hepatic negative GI ROS, Neg liver ROS,   Endo/Other  negative endocrine ROS  Renal/GU negative Renal ROS     Musculoskeletal   Abdominal Normal abdominal exam  (+)   Peds negative pediatric ROS (+)  Hematology negative hematology ROS (+)   Anesthesia Other Findings   Reproductive/Obstetrics (+) Pregnancy                             Anesthesia Physical  Anesthesia Plan  ASA: II  Anesthesia Plan: Spinal   Post-op Pain Management:    Induction:   PONV Risk Score and Plan:   Airway Management Planned:   Additional Equipment:   Intra-op Plan:   Post-operative Plan:   Informed Consent: I have reviewed the patients History and Physical, chart, labs and discussed the procedure including the risks, benefits and alternatives for the proposed anesthesia with the patient or authorized representative who has indicated his/her understanding and acceptance.     Plan Discussed with: CRNA and Surgeon  Anesthesia Plan Comments:         Anesthesia Quick Evaluation

## 2017-05-24 NOTE — Anesthesia Procedure Notes (Signed)
Spinal  Patient location during procedure: OR Start time: 05/24/2017 7:48 AM End time: 05/24/2017 7:53 AM Staffing Anesthesiologist: Ouita Nish Preanesthetic Checklist Completed: patient identified, site marked, surgical consent, pre-op evaluation, timeout performed, IV checked, risks and benefits discussed and monitors and equipment checked Spinal Block Patient position: sitting Prep: DuraPrep Patient monitoring: heart rate, cardiac monitor, continuous pulse ox and blood pressure Approach: midline Location: L3-4 Injection technique: single-shot Needle Needle type: Sprotte  Needle gauge: 24 G Needle length: 9 cm Assessment Sensory level: T4

## 2017-05-24 NOTE — Op Note (Signed)
Cesarean Section Procedure Note  Indications: 33 wks di/di twins Preterm premature rupture of membranes/ breech presentation x2 and h/o cesarean section x2  Pre-operative Diagnosis: 34 week 0 day pregnancy.Di/Di twins .Marland Kitchen. PPROM .Marland Kitchen. Breech presentation x 2.. H/o cesarean section x 2   Post-operative Diagnosis: same  Surgeon: Jessee AversOLE,Callyn Severtson J.   Assistants: Myna HidalgoJennifer Ozan D.O.  Anesthesia: Spinal anesthesia  ASA Class: 2   Procedure Details   The patient was seen in the Holding Room. The risks, benefits, complications, treatment options, and expected outcomes were discussed with the patient.  The patient concurred with the proposed plan, giving informed consent.  The site of surgery properly noted/marked. The patient was taken to Operating Room # 9, identified as Wanda Payne and the procedure verified as C-Section Delivery. A Time Out was held and the above information confirmed.  After induction of anesthesia, the patient was draped and prepped in the usual sterile manner. A Pfannenstiel incision was made and carried down through the subcutaneous tissue to the fascia. Fascial incision was made and extended transversely. The fascia was separated from the underlying rectus tissue superiorly and inferiorly. The peritoneum was identified and entered. Peritoneal incision was extended longitudinally. The utero-vesical peritoneal reflection was incised transversely and the bladder flap was bluntly freed from the lower uterine segment. A low transverse uterine incision was made. Delivered from breech frank presentation was a  Female with Apgar scores of 8 at one minute and 8 at five minutes. Followed by Rupture of membranes of Twin B delivered from footling breech presentation.apgars of 8 at 1 minute and 8 at five minutes.    After the umbilical cord was clamped and cut cord blood was obtained for evaluation. The placenta was removed intact and appeared normal. The uterine outline, tubes and ovaries appeared  normal. The uterine incision was closed with running locked sutures of 0 vciryl. A second layer of 0 vicryl was used to imbricate the incision. . Hemostasis was observed. Lavage was carried out until clear.  Interseen was placed along the uterine incision. The bladder reflection was noted to bleed this was controlled with bovie and arist. The fascia was then reapproximated with running sutures of 0 pds. The peritoneum was reapproximated with 2-0 vicryl. The subcutaneous layer was reapproximated with 2-0 vicryl The skin was reapproximated with 4-0 vicryl. .  Instrument, sponge, and needle counts were correct prior the abdominal closure and at the conclusion of the case.   Findings: Female infant in frank breech presentation . Female infant in footling breech presentation. Normal fallopian tubes and ovaries   Estimated Blood Loss:  928         Drains: None         Total IV Fluids:   Per anesthesia ml         Specimens: Placenta and Disposition:  Sent to Pathology          Implants: None         Complications:  None; patient tolerated the procedure well.         Disposition: PACU - hemodynamically stable.         Condition: stable  Attending Attestation: I performed the procedure.

## 2017-05-24 NOTE — H&P (Signed)
Date of Initial H&P: 05/17/2017 History reviewed, patient examined, no change in status, stable for surgery.

## 2017-05-24 NOTE — Anesthesia Postprocedure Evaluation (Signed)
Anesthesia Post Note  Patient: [name]  Procedure(s) Performed: Procedure(s) (LRB): CESAREAN SECTION MULTI-GESTATIONAL (N/A)     Patient location during evaluation: PACU Anesthesia Type: Spinal Level of consciousness: oriented and awake and alert Pain management: pain level controlled Vital Signs Assessment: post-procedure vital signs reviewed and stable Respiratory status: spontaneous breathing, respiratory function stable and patient connected to nasal cannula oxygen Cardiovascular status: blood pressure returned to baseline and stable Postop Assessment: no headache and no backache Anesthetic complications: no    Last Vitals:  Vitals:   05/24/17 1055 05/24/17 1100  BP:  106/65  Pulse: 90 74  Resp: 20 19  Temp:  36.8 C  SpO2: 98% 97%    Last Pain:  Vitals:   05/24/17 1100  TempSrc: Axillary  PainSc: 0-No pain   Pain Goal: Patients Stated Pain Goal: 2 (05/20/17 2010)               [name]

## 2017-05-24 NOTE — Transfer of Care (Signed)
Immediate Anesthesia Transfer of Care Note  Patient: Wanda Payne  Procedure(s) Performed: Procedure(s) with comments: CESAREAN SECTION MULTI-GESTATIONAL (N/A) - Dr Charlotta Newtonzan to assist   Patient Location: PACU  Anesthesia Type:Spinal  Level of Consciousness: awake, alert  and oriented  Airway & Oxygen Therapy: Patient Spontanous Breathing  Post-op Assessment: Report given to RN and Post -op Vital signs reviewed and stable  Post vital signs: Reviewed and stable  Last Vitals:  Vitals:   05/24/17 0019 05/24/17 0615  BP: 131/78 125/80  Pulse: (!) 106 (!) 118  Resp: 16 16  Temp: 36.7 C 36.5 C  SpO2: 97% 99%    Last Pain:  Vitals:   05/24/17 0626  TempSrc:   PainSc: 0-No pain      Patients Stated Pain Goal: 2 (05/20/17 2010)  Complications: No apparent anesthesia complications

## 2017-05-24 NOTE — Anesthesia Postprocedure Evaluation (Signed)
Anesthesia Post Note  Patient: Wanda CohnMary L Payne  Procedure(s) Performed: Procedure(s) (LRB): CESAREAN SECTION MULTI-GESTATIONAL (N/A)     Patient location during evaluation: [facility] Anesthesia Type: Spinal Level of consciousness: awake and alert and oriented Pain management: satisfactory to patient Vital Signs Assessment: post-procedure vital signs reviewed and stable Respiratory status: respiratory function stable and spontaneous breathing Cardiovascular status: blood pressure returned to baseline Postop Assessment: no headache, no backache, spinal receding, patient able to bend at knees and adequate PO intake Anesthetic complications: no    Last Vitals:  Vitals:   05/24/17 1400 05/24/17 1500  BP: 114/64 114/64  Pulse: 76 73  Resp: 18 18  Temp: 36.9 C 36.7 C  SpO2:  98%    Last Pain:  Vitals:   05/24/17 1500  TempSrc: Oral  PainSc:    Pain Goal: Patients Stated Pain Goal: 2 (05/20/17 2010)               Roxy Mastandrea

## 2017-05-24 NOTE — Addendum Note (Signed)
Addendum  created 05/24/17 1624 by Graciela HusbandsFussell, Jaxon Mynhier O, CRNA   Sign clinical note

## 2017-05-25 LAB — CBC
HEMATOCRIT: 38.7 % (ref 36.0–46.0)
HEMOGLOBIN: 13.4 g/dL (ref 12.0–15.0)
MCH: 30.7 pg (ref 26.0–34.0)
MCHC: 34.6 g/dL (ref 30.0–36.0)
MCV: 88.6 fL (ref 78.0–100.0)
Platelets: 215 10*3/uL (ref 150–400)
RBC: 4.37 MIL/uL (ref 3.87–5.11)
RDW: 14.5 % (ref 11.5–15.5)
WBC: 15.4 10*3/uL — ABNORMAL HIGH (ref 4.0–10.5)

## 2017-05-25 MED ORDER — HYDROMORPHONE HCL 2 MG PO TABS
1.0000 mg | ORAL_TABLET | Freq: Once | ORAL | Status: AC
  Administered 2017-05-25: 1 mg via ORAL
  Filled 2017-05-25: qty 1

## 2017-05-25 NOTE — Progress Notes (Signed)
Subjective: Postpartum Day 1: Cesarean Delivery Pt appears uncomfortable in bed.  States she is having difficulty managing her pain early this am.  When she is lying still in bed, she is usually ok, but with movement, she has a difficult time.  Rating her pain at rest at least a 5/10.  Lochia moderate. Denies F/C/CP/SOB.  Tolerating gen diet. No nausea/vomiting.  No flatus or BM.  Plans on breastfeeding.    Objective: Vital signs in last 24 hours: Temp:  [97.9 F (36.6 C)-98.4 F (36.9 C)] 98 F (36.7 C) (09/02 0807) Pulse Rate:  [73-105] 81 (09/02 0807) Resp:  [16-23] 18 (09/02 0807) BP: (97-126)/(56-81) 104/66 (09/02 0807) SpO2:  [94 %-100 %] 96 % (09/02 0807)  Physical Exam:  General: alert, cooperative and no distress  CV: RRR Lungs: CTAB Abd: obese, soft, appropriately tender, uterus firm, below umbilicus Incision: old blood noted, PICO dressing in place DVT Evaluation: No evidence of DVT seen on physical exam.  No edema bilaterally.   Results for orders placed or performed during the hospital encounter of 05/17/17 (from the past 24 hour(s))  CBC     Status: Abnormal   Collection Time: 05/25/17  5:28 AM  Result Value Ref Range   WBC 15.4 (H) 4.0 - 10.5 K/uL   RBC 4.37 3.87 - 5.11 MIL/uL   Hemoglobin 13.4 12.0 - 15.0 g/dL   HCT 09.838.7 11.936.0 - 14.746.0 %   MCV 88.6 78.0 - 100.0 fL   MCH 30.7 26.0 - 34.0 pg   MCHC 34.6 30.0 - 36.0 g/dL   RDW 82.914.5 56.211.5 - 13.015.5 %   Platelets 215 150 - 400 K/uL    Assessment/Plan: 40yo [id number] s/p repeat C-section, POD #1 -Pain: Pt to be given additional Dilaudid this am, will then work on pain management more regularly today -GI: Tolerating gen diet -GU: Voiding freely -Heme: Uterine atony noted yesterday, pt on Methergine x 6 doses, bleeding appropriately, Hgb and vitals signs stable as above.  Will continue to monitor -DVT prophylaxis: SCDs while in bed, encourage ambulation as tolerated  Continue routine postoperative care.  Wanda HidalgoJennifer Prospero Mahnke,  DO [phone] (pager) [phone] (office)      Wanda HidalgoZAN, Raliyah Montella, M 05/25/2017, 10:00 AM

## 2017-05-25 NOTE — Lactation Note (Signed)
This note was copied from a baby's chart. Lactation Consultation Note  Patient Name: Evlyn CourierBoyA Aujanae Muchmore VWUJW'JToday's Date: 05/25/2017 Reason for consult: Initial assessment;Late-preterm 34-36.6wks, babies in NICU. Mom now has 5 children, the oldest 6 1/2 years. She was able to breast feed her first child, bu her first set of twins were term, and she had trouble keeping up with her supply, and both babies got thrush, and mom said she thinks she had a mild case of breast thrush.  Mom is pumping and expressing about 2-3 ml's of colostrum with each pumping. She knows to add HE, and to sue initiation setting, and to pump at least every 3 hours.  Mom has a personal DEP at home. Mom encouraged to hold her babies skin to skin, when able, and to allow them to nuzzle at the beast, during ng feeds. Mom knows to call for questions/concerns   Maternal Data Formula Feeding for Exclusion: Yes Reason for exclusion:  (babies in NICU) Has patient been taught Hand Expression?: No (mom knows how to hand express) Does the patient have breastfeeding experience prior to this delivery?: Yes  Feeding    LATCH Score                   Interventions Interventions: DEBP  Lactation Tools Discussed/Used Pump Review: Setup, frequency, and cleaning;Milk Storage;Other (comment) Initiated by:: bedside RN Date initiated:: 05/24/17   Consult Status Consult Status: Follow-up Date: 05/26/17 Follow-up type: In-patient    Alfred LevinsLee, Nuria Phebus Anne 05/25/2017, 10:10 AM

## 2017-05-25 NOTE — Progress Notes (Signed)
CSW acknowledges NICU admission.    Patient screened out for psychosocial assessment since none of the following apply:  Psychosocial stressors documented in mother or baby's chart  Gestation less than 32 weeks  Code at delivery   Infant with anomalies  Please contact the Clinical Social Worker if specific needs arise, or by MOB's request.       

## 2017-05-26 MED ORDER — HYDROCODONE-ACETAMINOPHEN 5-325 MG PO TABS
1.0000 | ORAL_TABLET | ORAL | Status: DC | PRN
Start: 2017-05-26 — End: 2017-05-27
  Administered 2017-05-26: 2 via ORAL
  Administered 2017-05-26 – 2017-05-27 (×4): 1 via ORAL
  Filled 2017-05-26: qty 2
  Filled 2017-05-26: qty 1
  Filled 2017-05-26: qty 2
  Filled 2017-05-26: qty 1

## 2017-05-26 NOTE — Plan of Care (Signed)
Problem: Role Relationship: Goal: Ability to demonstrate positive interaction with newborn will improve Outcome: Progressing Babies in NICU

## 2017-05-26 NOTE — Progress Notes (Signed)
Subjective: Postpartum Day 3: Cesarean Delivery Patient reports incisional pain, tolerating PO, + flatus and no problems voiding.   Patient rates pain 4 out 10. She states that vicodin helps pain more than oxycodone .   Objective: Vital signs in last 24 hours: Temp:  [97.7 F (36.5 C)-98.1 F (36.7 C)] 98 F (36.7 C) (09/02 1948) Pulse Rate:  [80-89] 89 (09/02 1948) Resp:  [18] 18 (09/02 1948) BP: (115-120)/(63-75) 115/75 (09/02 1948) SpO2:  [96 %-97 %] 96 % (09/02 1948)  Physical Exam:  General: alert, cooperative and no distress Lochia: appropriate Uterine Fundus: firm Incision: PICO in place slightly blood tinged  DVT Evaluation: No evidence of DVT seen on physical exam.   Recent Labs  05/25/17 0528  HGB 13.4  HCT 38.7    Assessment/Plan: Status post Cesarean section. Doing well postoperatively. - pain - add vicodin d/c oxycodone.  Encourage ambulation in halls.  Patient may shower.  Plan discharge home tomorrow .  Syreeta Figler J. 05/26/2017, 8:14 AM

## 2017-05-26 NOTE — Lactation Note (Signed)
This note was copied from a baby's chart. Lactation Consultation Note  Patient Name: Wanda Payne ZOXWR'UToday's Date: 05/26/2017 Reason for consult: Follow-up assessment;Late-preterm 34-36.6wks;Multiple gestation;NICU baby   Follow-up with mom of NICU twins at 6057 hrs old; GA 34.0; BW <6 lbs.   Mom reports pumping every 3 hrs and is getting 60 ml milk.  Mom has been pumping on "initiate" setting. Mom also reports her breasts are filling and getting sore.  [facility]  Mom has been using coconut oil on breast.  Suggested mom can use coconut oil on flanges with pumping. Reports using #24 flanges.   Taught mom how to switch to pumping on "maintain" setting for 20-30 min. using hands-on pumping and encouraged additional hand expression at end of pumping session.   Encouraged mom to pump again in 30-45 minutes using "maintain" setting after she eater her supper.   Encouraged doing STS in NICU with infants with feedings &/or inquiring NICU staff about letting infants begin to latch prior to scheduled feedings so infant can begin learning to breastfeed.   Mom has Medela DEBP at home she plans to use after discharge.   Encouraged mom to call for questions or concerns.      Maternal Data Does the patient have breastfeeding experience prior to this delivery?: Yes    Consult Status Consult Status: Follow-up Date: 05/27/17 Follow-up type: In-patient    Wanda Payne, Wanda Payne 05/26/2017, 5:56 PM

## 2017-05-27 DIAGNOSIS — O30049 Twin pregnancy, dichorionic/diamniotic, unspecified trimester: Secondary | ICD-10-CM | POA: Diagnosis present

## 2017-05-27 MED ORDER — IBUPROFEN 600 MG PO TABS: 600.0000 mg | ORAL_TABLET | Freq: Four times a day (QID) | ORAL | 1 refills | Status: AC | PRN

## 2017-05-27 MED ORDER — HYDROCODONE-ACETAMINOPHEN 5-325 MG PO TABS: 1.0000 | ORAL_TABLET | ORAL | 0 refills | Status: AC | PRN

## 2017-05-27 NOTE — Discharge Summary (Signed)
OB Discharge Summary     Patient Name: Wanda Payne DOB: Sep 01, 1976 MRN: [id number]  Date of admission: 05/17/2017 Delivering MD:    Aradhana, Gin [[id number]]  HQIO, NGEX   Azalea, Cedar Bunker [[id number]]  Gerald Leitz   Date of discharge: 05/27/2017  Admitting diagnosis: 60 WKS, LEAKING FLUID Repeat Cesarean Section Multiple Gestation Intrauterine pregnancy: [redacted]w[redacted]d     Secondary diagnosis:  Active Problems:   PROM (premature rupture of membranes)   Previous cesarean delivery, antepartum condition or complication   Preterm premature rupture of membranes (PPROM) with unknown onset of labor   Twin pregnancy, twins dichorionic and diamniotic  Additional problems: None     Discharge diagnosis: Preterm Pregnancy Delivered                                                                                                Post partum procedures:None  Augmentation: NA  Complications: None  Hospital course:  Sceduled C/S   41 y.o. yo [phone] at [redacted]w[redacted]d was admitted to the hospital 05/17/2017 for scheduled cesarean section with the following indication:Prior Uterine Surgery and Multifetal Gestation.  Membrane Rupture Time/Date:    Ayona, Yniguez [[id number]]  6:00 AM   Nicosha, Struve Jackson [[id number]]  6:00 AM ,   Lenah, Messenger [[id number]]  05/19/2017   Mayce, Noyes [[id number]]  05/19/2017   Patient delivered a Viable infant.   Chiante, Peden [[id number]]  05/24/2017   Jalyssa, Fleisher [[id number]]  05/24/2017  Details of operation can be found in separate operative note.  Pateint had an uncomplicated postpartum course.  She is ambulating, tolerating a regular diet, passing flatus, and urinating well. Patient is discharged home in stable condition on  05/27/17         Physical exam  Vitals:   05/25/17 1613 05/25/17 1948 05/26/17 0800 05/26/17 1954  BP: 117/63 115/75 120/67 128/75  Pulse: 80 89 86 (!) 101  Resp: 18 18 20 18   Temp: 98.1 F (36.7 C) 98 F  (36.7 C) 98 F (36.7 C) 98.5 F (36.9 C)  TempSrc: Oral Oral Oral Oral  SpO2: 96% 96% 97% 98%  Weight:      Height:       General: alert, cooperative and no distress Lochia: appropriate Uterine Fundus: firm Incision: Dressing is clean, dry, and intact DVT Evaluation: No evidence of DVT seen on physical exam. Labs: Lab Results  Component Value Date   WBC 15.4 (H) 05/25/2017   HGB 13.4 05/25/2017   HCT 38.7 05/25/2017   MCV 88.6 05/25/2017   PLT 215 05/25/2017   CMP Latest Ref Rng & Units 05/17/2017  Glucose 65 - 99 mg/dL 96  BUN 6 - 20 mg/dL 8  Creatinine 5.42 - 7.06 mg/dL 2.37  Sodium 628 - 315 mmol/L 137  Potassium 3.5 - 5.1 mmol/L 4.0  Chloride 101 - 111 mmol/L 107  CO2 22 - 32 mmol/L 20(L)  Calcium 8.9 - 10.3 mg/dL 9.6  Total Protein 6.5 - 8.1 g/dL 6.3(L)  Total Bilirubin 0.3 - 1.2 mg/dL 0.6  Alkaline Phos 38 - 126 U/L  107  AST 15 - 41 U/L 25  ALT 14 - 54 U/L 16    Discharge instruction: per After Visit Summary and "Baby and Me Booklet".  After visit meds:  Allergies as of 05/27/2017      Reactions   Avocado Hives, Diarrhea   Latex Itching   Penicillins Hives, Other (See Comments)   Has patient had a PCN reaction causing immediate rash, facial/tongue/throat swelling, SOB or lightheadedness with hypotension: No Has patient had a PCN reaction causing severe rash involving mucus membranes or skin necrosis: No Has patient had a PCN reaction that required hospitalization: No Has patient had a PCN reaction occurring within the last 10 years: No If all of the above answers are "NO", then may proceed with Cephalosporin use.      Medication List    TAKE these medications   acetaminophen 500 MG tablet Commonly known as:  TYLENOL Take 500 mg by mouth every 6 (six) hours as needed for mild pain, moderate pain, fever or headache.   famotidine 10 MG tablet Commonly known as:  PEPCID Take 10 mg by mouth every evening.   HYDROcodone-acetaminophen 5-325 MG  tablet Commonly known as:  NORCO/VICODIN Take 1-2 tablets by mouth every 4 (four) hours as needed for moderate pain or severe pain.   ibuprofen 600 MG tablet Commonly known as:  ADVIL,MOTRIN Take 1 tablet (600 mg total) by mouth every 6 (six) hours as needed.   prenatal multivitamin Tabs tablet Take 1 tablet by mouth at bedtime.            Discharge Care Instructions        Start     Ordered   05/27/17 0000  ibuprofen (ADVIL,MOTRIN) 600 MG tablet  Every 6 hours PRN     05/27/17 0734   05/27/17 0000  HYDROcodone-acetaminophen (NORCO/VICODIN) 5-325 MG tablet  Every 4 hours PRN     05/27/17 0734   05/27/17 0000  Call MD for:  temperature >100.4     05/27/17 0734   05/27/17 0000  Call MD for:  persistant nausea and vomiting     05/27/17 0734   05/27/17 0000  Call MD for:  severe uncontrolled pain     05/27/17 0734   05/27/17 0000  Call MD for:  redness, tenderness, or signs of infection (pain, swelling, redness, odor or green/yellow discharge around incision site)     05/27/17 0734   05/27/17 0000  Activity as tolerated     05/27/17 0734   05/27/17 0000  Lifting restrictions    Comments:  Weight restriction of 15 lbs.   05/27/17 0734   05/27/17 0000  Driving restriction     Comments:  Avoid driving for at least 2 weeks.   05/27/17 0734   05/27/17 0000  Sexual acrtivity    Comments:  Avoid sex for 6 weeks   05/27/17 0734   05/27/17 0000  Leave dressing on - Keep it clean, dry, and intact until clinic visit    Comments:  Once vac stops working you may remove it   05/27/17 0734   05/27/17 0000  Diet general     05/27/17 0734      Diet: routine diet  Activity: Advance as tolerated. Pelvic rest for 6 weeks.   Outpatient follow up:2 weeks Follow up Appt:No future appointments. Follow up Visit:No Follow-up on file.  Postpartum contraception: Not Discussed  Newborn Data:   Emmah, Bratcher [[id number]]  Live born female  Birth Weight: 5 lb  10.3 oz (2560 g) APGAR:  8, 8   Warnell BureauHaugen, GirlB Susie [[id number]][[id number]]  Live born female  Birth Weight: 5 lb 14.9 oz (2690 g) APGAR: 8, 8  Baby Feeding: Bottle and Breast Disposition:home with mother   05/27/2017 Jessee AversOLE,Johnika Escareno J., MD

## 2017-06-12 ENCOUNTER — Encounter (HOSPITAL_COMMUNITY): Payer: Self-pay | Admitting: *Deleted

## 2017-06-20 ENCOUNTER — Encounter (HOSPITAL_COMMUNITY): Admission: RE | Admit: 2017-06-20 | Payer: BLUE CROSS/BLUE SHIELD | Source: Ambulatory Visit

## 2017-06-23 ENCOUNTER — Encounter (HOSPITAL_COMMUNITY): Admission: RE | Payer: Self-pay | Source: Ambulatory Visit

## 2017-06-23 ENCOUNTER — Inpatient Hospital Stay (HOSPITAL_COMMUNITY)
Admission: RE | Admit: 2017-06-23 | Payer: BLUE CROSS/BLUE SHIELD | Source: Ambulatory Visit | Admitting: Obstetrics and Gynecology

## 2017-06-23 SURGERY — Surgical Case
Anesthesia: Choice

## 2017-09-27 ENCOUNTER — Encounter (HOSPITAL_COMMUNITY): Payer: Self-pay

## 2021-03-10 ENCOUNTER — Ambulatory Visit (HOSPITAL_COMMUNITY)
Admission: EM | Admit: 2021-03-10 | Discharge: 2021-03-10 | Disposition: A | Discharge status: Home / Self Care | Payer: BC Managed Care – PPO | Attending: Student | Admitting: Student

## 2021-03-10 ENCOUNTER — Encounter (HOSPITAL_COMMUNITY): Payer: Self-pay

## 2021-03-10 DIAGNOSIS — R0789 Other chest pain: Secondary | ICD-10-CM | POA: Diagnosis not present

## 2021-03-10 DIAGNOSIS — Z8709 Personal history of other diseases of the respiratory system: Secondary | ICD-10-CM

## 2021-03-10 DIAGNOSIS — J209 Acute bronchitis, unspecified: Secondary | ICD-10-CM

## 2021-03-10 MED ORDER — PREDNISONE 10 MG (21) PO TBPK: ORAL_TABLET | Freq: Every day | ORAL | 0 refills | Status: AC

## 2021-03-10 MED ORDER — PROMETHAZINE-DM 6.25-15 MG/5ML PO SYRP: 5.0000 mL | ORAL_SOLUTION | Freq: Four times a day (QID) | ORAL | 0 refills | Status: AC | PRN

## 2021-03-10 MED ORDER — ALBUTEROL SULFATE HFA 108 (90 BASE) MCG/ACT IN AERS: 1.0000 | INHALATION_SPRAY | Freq: Four times a day (QID) | RESPIRATORY_TRACT | 0 refills | Status: AC | PRN

## 2021-03-10 NOTE — Discharge Instructions (Addendum)
-  Prednisone taper for cough/bronchitis. I recommend taking this in the morning as it could give you energy.  -Albuterol inhaler as needed for cough, wheezing, shortness of breath-up to every 6 hours. -Promethazine DM cough syrup for congestion/cough. This could make you drowsy, so take at night before bed. -You do not have bacterial pneumonia today, but if you develop new symptoms like worsening shortness of breath, new fever/chills, dizziness, left-sided chest pain at rest, weakness-seek additional immediate medical attention.

## 2021-03-10 NOTE — ED Triage Notes (Signed)
Pt reports cough and shortness of breath on exertion, nasal congestion since 03/28/2021; chest congestion x 3 days, chest tightness in the past 24 hrs. Chest tightness in worse when coughing. Denies vision changes, dizziness, abdominal pain, diarrhea, weakness. Mucinex helped for 2 weeks, then stopped working.

## 2021-03-10 NOTE — ED Provider Notes (Signed)
[facility]    CSN: [id number] Arrival date & time: 03/10/21  1507      History   Chief Complaint Chief Complaint  Patient presents with   Cough   Shortness of Breath    HPI Wanda Payne is a 45 y.o. female presenting with cough and shortness of breath x2 weeks, getting worse.  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 Cough productive of yellow sputum. DOE. Central chest wall tenderness with deep inspiration and movement.  States chest congestion and DOE getting worse over the last 3 days. Denies vision changes, dizziness, abdominal pain, diarrhea, weakness, fevers/chills, n/v/d/c. Mucinex helped for 2 weeks, then stopped working.   HPI  Past Medical History:  Diagnosis Date   Asthma    exercise induced as a child   Endometriosis    Fibromyalgia    Fibromyalgia    History of kidney stones    Infertility, female    MVP (mitral valve prolapse)    as teenager    Patient Active Problem List   Diagnosis Date Noted   Twin pregnancy, twins dichorionic and diamniotic 05/27/2017   Preterm premature rupture of membranes (PPROM) with unknown onset of labor 05/18/2017   PROM (premature rupture of membranes) 05/17/2017   Previous cesarean delivery, antepartum condition or complication 05/17/2017   Status post repeat low transverse cesarean section--twins 04/08/2013    Past Surgical History:  Procedure Laterality Date   CESAREAN SECTION     CESAREAN SECTION N/A 04/07/2013   Procedure: CESAREAN SECTION/TWINS;  Surgeon: Dorien Chihuahua. Richardson Dopp, MD;  Location: [facility];  Service: Obstetrics;  Laterality: N/A;   CESAREAN SECTION MULTI-GESTATIONAL N/A 05/24/2017   Procedure: CESAREAN SECTION MULTI-GESTATIONAL;  Surgeon: Gerald Leitz, MD;  Location: [facility];  Service: Obstetrics;  Laterality: N/A;  Dr Charlotta Newton to assist    EXPLORATORY LAPAROTOMY     kidney stent     LAPAROSCOPY     WISDOM TOOTH EXTRACTION      OB History     Gravida  5   Para  3    Term  2   Preterm  1   AB      Living  5      SAB      IAB      Ectopic      Multiple  2   Live Births  5            Home Medications    Prior to Admission medications   Medication Sig Start Date End Date Taking? Authorizing Provider  albuterol (VENTOLIN HFA) 108 (90 Base) MCG/ACT inhaler Inhale 1-2 puffs into the lungs every 6 (six) hours as needed for wheezing or shortness of breath. 03/10/21  Yes Rhys Martini, PA-C  predniSONE (STERAPRED UNI-PAK 21 TAB) 10 MG (21) TBPK tablet Take by mouth daily. Take 6 tabs by mouth daily  for 2 days, then 5 tabs for 2 days, then 4 tabs for 2 days, then 3 tabs for 2 days, 2 tabs for 2 days, then 1 tab by mouth daily for 2 days 03/10/21  Yes Rhys Martini, PA-C  promethazine-dextromethorphan (PROMETHAZINE-DM) 6.25-15 MG/5ML syrup Take 5 mLs by mouth 4 (four) times daily as needed for cough. 03/10/21  Yes Rhys Martini, PA-C  acetaminophen (TYLENOL) 500 MG tablet Take 500 mg by mouth every 6 (six) hours as needed for mild pain, moderate pain, fever or headache.    [provider]  famotidine (PEPCID) 10  MG tablet Take 10 mg by mouth every evening.    [provider]  HYDROcodone-acetaminophen (NORCO/VICODIN) 5-325 MG tablet Take 1-2 tablets by mouth every 4 (four) hours as needed for moderate pain or severe pain. 05/27/17   Gerald Leitz, MD  ibuprofen (ADVIL,MOTRIN) 600 MG tablet Take 1 tablet (600 mg total) by mouth every 6 (six) hours as needed. 05/27/17   Gerald Leitz, MD  Prenatal Vit-Fe Fumarate-FA (PRENATAL MULTIVITAMIN) TABS Take 1 tablet by mouth at bedtime.     [provider]    Family History Family History  Problem Relation Age of Onset   Healthy Mother    Hypertension Father     Social History Social History   Tobacco Use   Smoking status: Never   Smokeless tobacco: Never  Vaping Use   Vaping Use: Never used  Substance Use Topics   Alcohol use: No   Drug use: No     Allergies    Avocado, Latex, and Penicillins   Review of Systems Review of Systems  Constitutional:  Negative for appetite change, chills and fever.  HENT:  Positive for congestion. Negative for ear pain, rhinorrhea, sinus pressure, sinus pain and sore throat.   Eyes:  Negative for redness and visual disturbance.  Respiratory:  Positive for cough and shortness of breath. Negative for chest tightness and wheezing.   Cardiovascular:  Negative for chest pain and palpitations.  Gastrointestinal:  Negative for abdominal pain, constipation, diarrhea, nausea and vomiting.  Genitourinary:  Negative for dysuria, frequency and urgency.  Musculoskeletal:  Negative for myalgias.  Neurological:  Negative for dizziness, weakness and headaches.  Psychiatric/Behavioral:  Negative for confusion.   All other systems reviewed and are negative.   Physical Exam Triage Vital Signs ED Triage Vitals [03/10/21 1557]  Enc Vitals Group     BP      Pulse      Resp      Temp      Temp src      SpO2      Weight      Height      Head Circumference      Peak Flow      Pain Score 0     Pain Loc      Pain Edu?      Excl. in GC?    No data found.  Updated Vital Signs BP 125/81 (BP Location: Left Arm)   Pulse 74   Temp 98.4 F (36.9 C) (Oral)   Resp 18   SpO2 97%   Breastfeeding No   Visual Acuity Right Eye Distance:   Left Eye Distance:   Bilateral Distance:    Right Eye Near:   Left Eye Near:    Bilateral Near:     Physical Exam Vitals reviewed.  Constitutional:      General: She is not in acute distress.    Appearance: Normal appearance. She is not ill-appearing.  HENT:     Head: Normocephalic and atraumatic.     Right Ear: Hearing, tympanic membrane, ear canal and external ear normal. No swelling or tenderness. There is no impacted cerumen. No mastoid tenderness. Tympanic membrane is not perforated, erythematous, retracted or bulging.     Left Ear: Hearing, tympanic membrane, ear canal and  external ear normal. No swelling or tenderness. There is no impacted cerumen. No mastoid tenderness. Tympanic membrane is not perforated, erythematous, retracted or bulging.     Nose:     Right Sinus: No maxillary  sinus tenderness or frontal sinus tenderness.     Left Sinus: No maxillary sinus tenderness or frontal sinus tenderness.     Mouth/Throat:     Mouth: Mucous membranes are moist.     Pharynx: Uvula midline. No oropharyngeal exudate or posterior oropharyngeal erythema.     Tonsils: No tonsillar exudate.  Cardiovascular:     Rate and Rhythm: Normal rate and regular rhythm.     Heart sounds: Normal heart sounds.  Pulmonary:     Breath sounds: Normal air entry. Decreased breath sounds present. No wheezing, rhonchi or rales.     Comments: Decreased breath sounds throughout Occ cough Chest:     Chest wall: Tenderness present.     Comments: Reproducible chest wall pain midsternum Abdominal:     General: Abdomen is flat. Bowel sounds are normal.     Tenderness: There is no abdominal tenderness. There is no guarding or rebound.  Lymphadenopathy:     Cervical: No cervical adenopathy.  Neurological:     General: No focal deficit present.     Mental Status: She is alert and oriented to person, place, and time.  Psychiatric:        Attention and Perception: Attention and perception normal.        Mood and Affect: Mood and affect normal.        Behavior: Behavior normal. Behavior is cooperative.        Thought Content: Thought content normal.        Judgment: Judgment normal.     UC Treatments / Results  Labs (all labs ordered are listed, but only abnormal results are displayed) Labs Reviewed - No data to display  EKG   Radiology No results found.  Procedures Procedures (including critical care time)  Medications Ordered in UC Medications - No data to display  Initial Impression / Assessment and Plan / UC Course  I have reviewed the triage vital signs and the nursing  notes.  Pertinent labs & imaging results that were available during my care of the patient were reviewed by me and considered in my medical decision making (see chart for details).     This patient is a 45 year old female presenting with acute bronchitis following viral URI.  Decreased breath sounds but no wheezes or rales.  She is afebrile, nontachycardic, nontachypneic, oxygenating well on room air.  Has not taken antipyretic.  Distant history of childhood asthma but no issues for decades.  Prednisone taper as below, she is not a diabetic.  Also sent promethazine, albuterol inhaler.  OCP for contraception.  ED return precautions discussed.  Final Clinical Impressions(s) / UC Diagnoses   Final diagnoses:  Acute bronchitis, unspecified organism  History of asthma  Chest wall pain     Discharge Instructions      -Prednisone taper for cough/bronchitis. I recommend taking this in the morning as it could give you energy.  -Albuterol inhaler as needed for cough, wheezing, shortness of breath-up to every 6 hours. -Promethazine DM cough syrup for congestion/cough. This could make you drowsy, so take at night before bed. -You do not have bacterial pneumonia today, but if you develop new symptoms like worsening shortness of breath, new fever/chills, dizziness, left-sided chest pain at rest, weakness-seek additional immediate medical attention.     ED Prescriptions     Medication Sig Dispense Auth. Provider   predniSONE (STERAPRED UNI-PAK 21 TAB) 10 MG (21) TBPK tablet Take by mouth daily. Take 6 tabs by mouth daily  for 2  days, then 5 tabs for 2 days, then 4 tabs for 2 days, then 3 tabs for 2 days, 2 tabs for 2 days, then 1 tab by mouth daily for 2 days 42 tablet Rhys Martini, PA-C   promethazine-dextromethorphan (PROMETHAZINE-DM) 6.25-15 MG/5ML syrup Take 5 mLs by mouth 4 (four) times daily as needed for cough. 118 mL Rhys Martini, PA-C   albuterol (VENTOLIN HFA) 108 (90 Base) MCG/ACT  inhaler Inhale 1-2 puffs into the lungs every 6 (six) hours as needed for wheezing or shortness of breath. 1 each Rhys Martini, PA-C      PDMP not reviewed this encounter.   Rhys Martini, PA-C 03/10/21 1655
